# Patient Record
Sex: Male | Born: 1955 | Race: White | Hispanic: No | Smoking: Never smoker
Health system: Southern US, Community
[De-identification: ages and names within clinical notes are randomized; demographics above are authoritative.]

## PROBLEM LIST (undated history)

## (undated) DIAGNOSIS — N529 Male erectile dysfunction, unspecified: Secondary | ICD-10-CM

## (undated) DIAGNOSIS — M519 Unspecified thoracic, thoracolumbar and lumbosacral intervertebral disc disorder: Secondary | ICD-10-CM

## (undated) DIAGNOSIS — I10 Essential (primary) hypertension: Secondary | ICD-10-CM

## (undated) DIAGNOSIS — N200 Calculus of kidney: Secondary | ICD-10-CM

## (undated) DIAGNOSIS — E7800 Pure hypercholesterolemia, unspecified: Secondary | ICD-10-CM

## (undated) DIAGNOSIS — N400 Enlarged prostate without lower urinary tract symptoms: Secondary | ICD-10-CM

## (undated) HISTORY — DX: Essential (primary) hypertension: I10

## (undated) HISTORY — DX: Pure hypercholesterolemia, unspecified: E78.00

## (undated) HISTORY — DX: Male erectile dysfunction, unspecified: N52.9

## (undated) HISTORY — DX: Unspecified thoracic, thoracolumbar and lumbosacral intervertebral disc disorder: M51.9

## (undated) HISTORY — DX: Calculus of kidney: N20.0

## (undated) HISTORY — DX: Benign prostatic hyperplasia without lower urinary tract symptoms: N40.0

---

## 2006-10-01 DIAGNOSIS — I10 Essential (primary) hypertension: Secondary | ICD-10-CM | POA: Insufficient documentation

## 2006-10-17 ENCOUNTER — Ambulatory Visit: Payer: Self-pay | Admitting: Gastroenterology

## 2009-02-16 ENCOUNTER — Ambulatory Visit: Payer: Self-pay | Admitting: Internal Medicine

## 2009-03-01 ENCOUNTER — Ambulatory Visit: Payer: Self-pay | Admitting: Internal Medicine

## 2011-05-15 DIAGNOSIS — E785 Hyperlipidemia, unspecified: Secondary | ICD-10-CM | POA: Insufficient documentation

## 2014-09-02 ENCOUNTER — Emergency Department: Payer: Self-pay | Admitting: Emergency Medicine

## 2014-11-01 DIAGNOSIS — S82141D Displaced bicondylar fracture of right tibia, subsequent encounter for closed fracture with routine healing: Secondary | ICD-10-CM | POA: Insufficient documentation

## 2015-02-11 DIAGNOSIS — M5430 Sciatica, unspecified side: Secondary | ICD-10-CM | POA: Insufficient documentation

## 2015-02-11 DIAGNOSIS — L500 Allergic urticaria: Secondary | ICD-10-CM | POA: Insufficient documentation

## 2015-02-11 DIAGNOSIS — M545 Low back pain, unspecified: Secondary | ICD-10-CM | POA: Insufficient documentation

## 2016-09-09 IMAGING — CT CT OF THE RIGHT KNEE WITHOUT CONTRAST
5 of 7 series · 14 of 33 positions shown, 15 images · non-contrast
Comparison: None.

CLINICAL DATA: Pt states he was working this am, fell through
floor, landed on his right leg, felt instant pain in his right knee,
states he can move it but cannot put weight on it.

EXAM:
CT OF THE RIGHT KNEE WITHOUT CONTRAST
TECHNIQUE: Multidetector CT imaging of the RIGHT knee was performed according
to the standard protocol. Multiplanar CT image reconstructions were
also generated.

[Series 2: knee · axial · 0.34mm/px · z∈[+676,+788]mm · 4 of 125 slices shown, 5 images]
[im 25/125  soft-tissue]
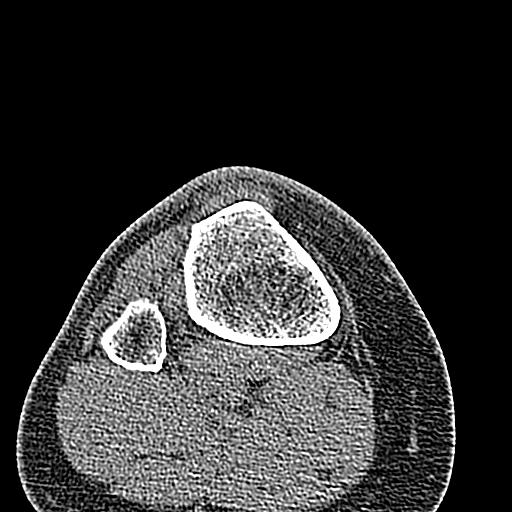
[im 25/125  bone]
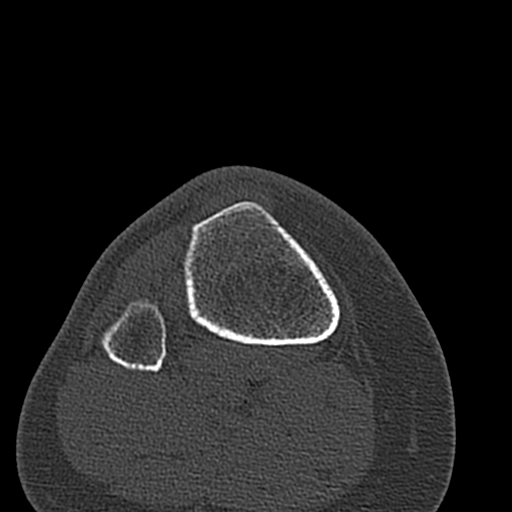
[im 50/125  bone]
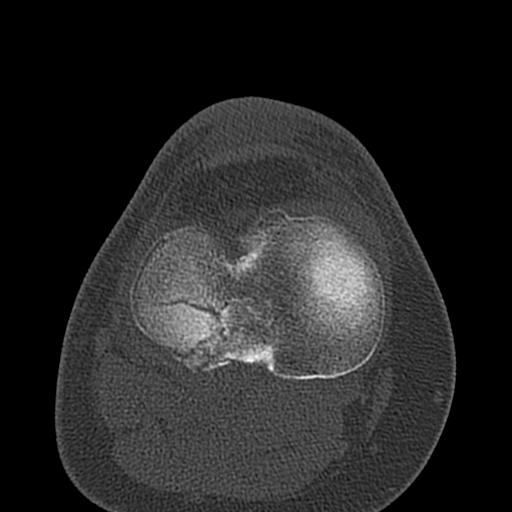
[im 75/125  bone]
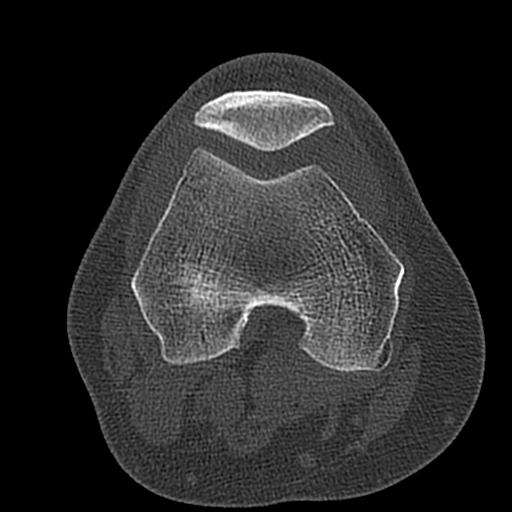
[im 100/125  bone]
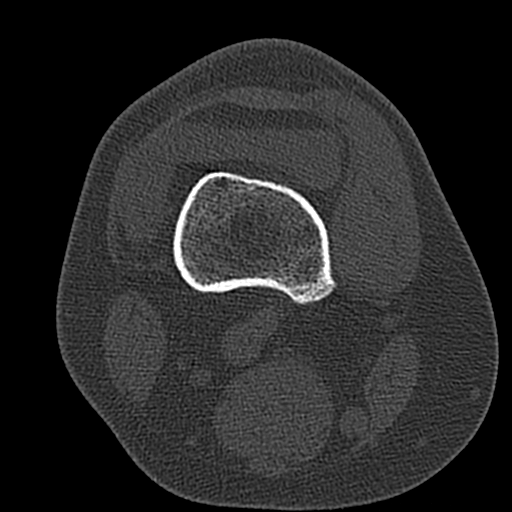

[Series 5: ax knee bone · axial · 0.34mm/px · z∈[+702,+764]mm · 2 of 94 slices shown]
[im 32/94  bone]
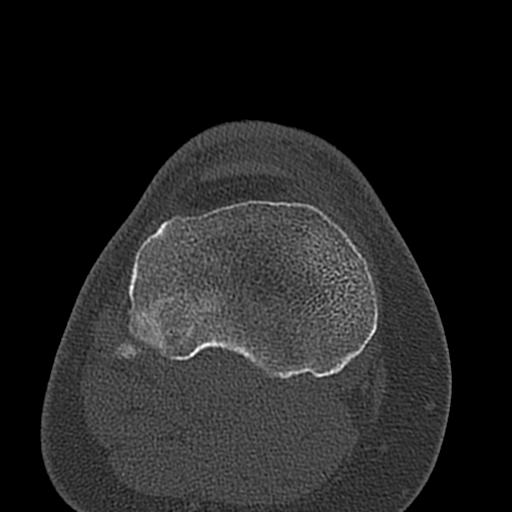
[im 63/94  bone]
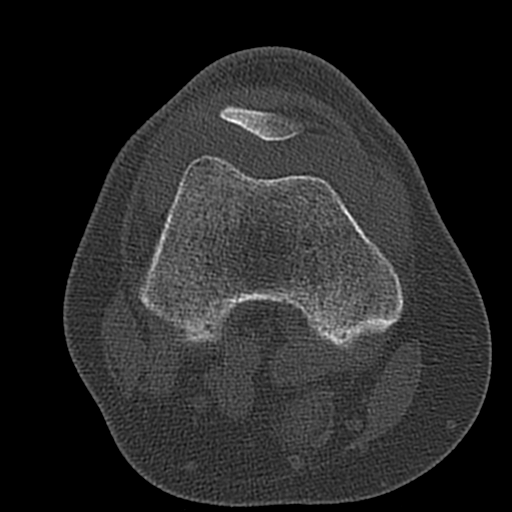

[Series 602: s axial knee 2x2 · axial · 0.37mm/px · z∈[+701,+753]mm · 2 of 78 slices shown]
[im 26/78  bone]
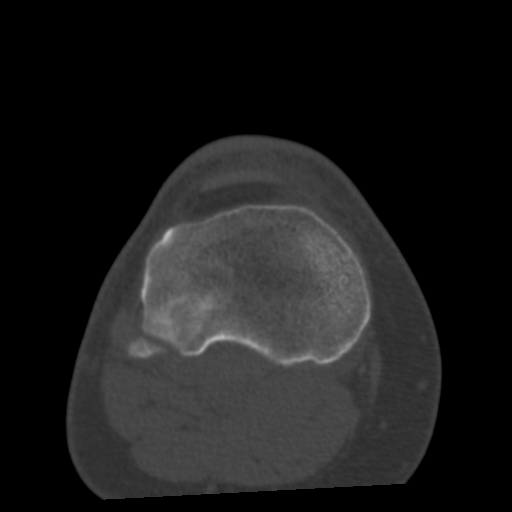
[im 52/78  bone]
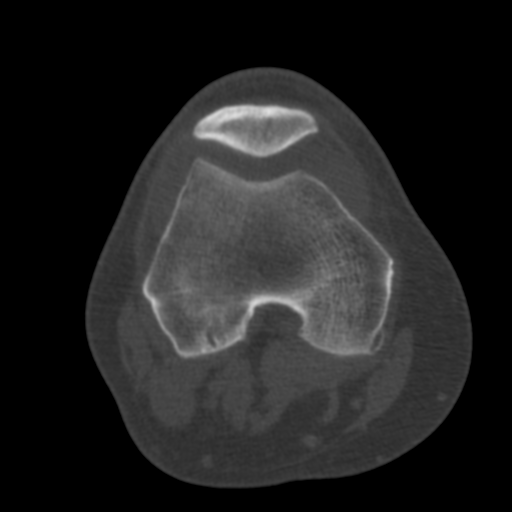

[Series 603: s coronal knee · coronal · 0.37mm/px · 1 of 77 slices shown]
[im 39/77  bone]
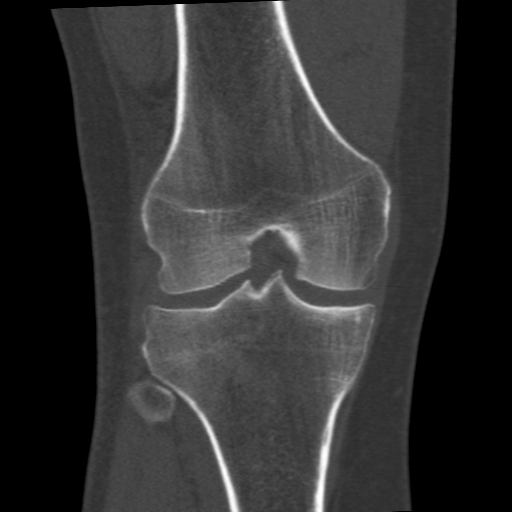

[Series 604: s sag knee · sagittal · 0.37mm/px · 5 of 67 slices shown]
[im 12/67  bone]
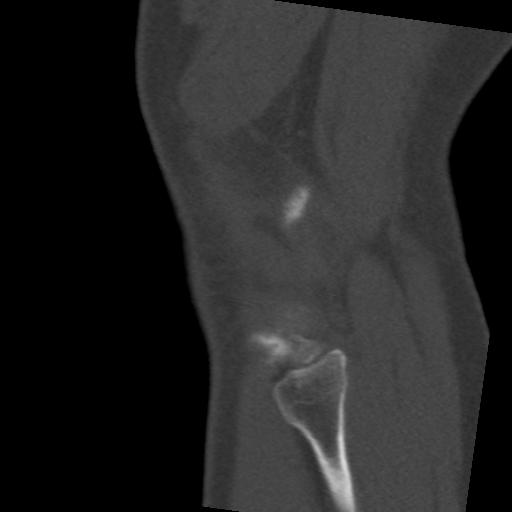
[im 23/67  bone]
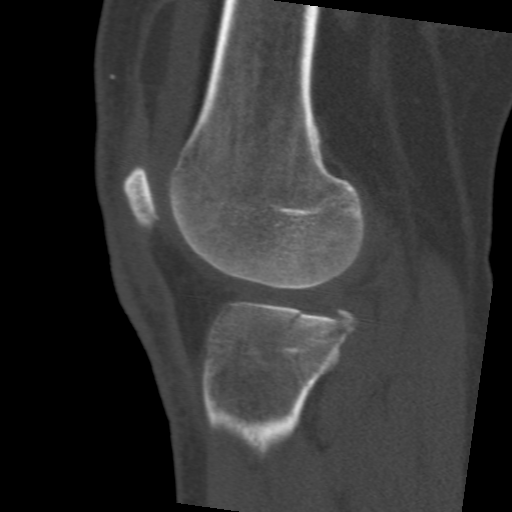
[im 34/67  bone]
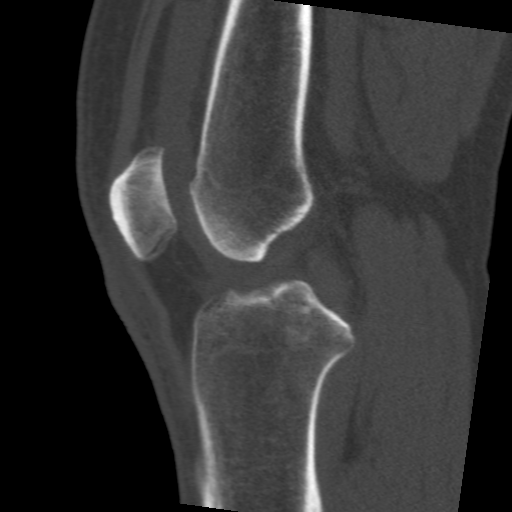
[im 45/67  bone]
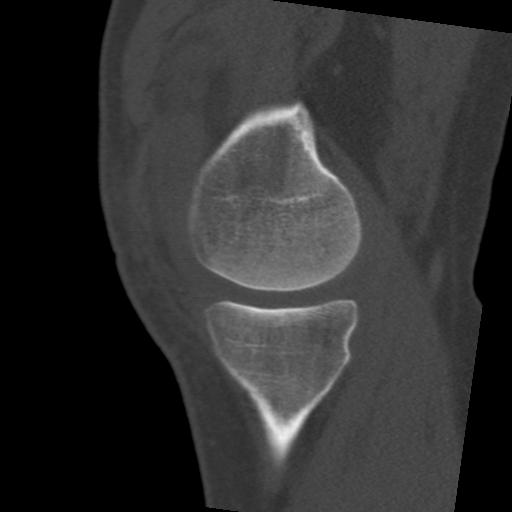
[im 56/67  bone]
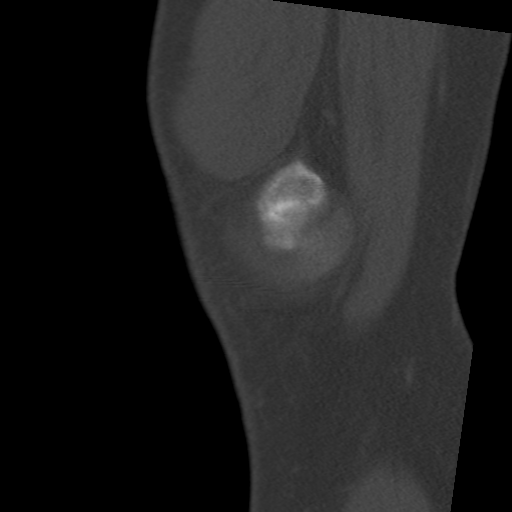

[14 of 33 positions shown; findings below may reference images not displayed]

FINDINGS: There is a mildly comminuted posterior lateral tibial plateau
fracture with fracture cleft extending to the medial aspect of the
lateral tibial plateau articular surface. There is a fracture cleft
extending to the articular surface of the tibiofibular joint. There
is 1-2 mm of depression of the posterior lateral tibial plateau
relative to the remainder of the articular surface. There is no
fracture extending into the diaphysis or into any medial tibial
plateau.

There is a large lipohemarthrosis.

There is no other fracture or dislocation. There is no lytic or
sclerotic osseous lesion. The ACL and PCL are grossly intact. The
extensor mechanism is grossly intact. There is prepatellar tendon
soft tissue edema. There is no Baker cyst. The muscles are normal.
There is no hematoma.
IMPRESSION: 1. Mildly comminuted right posterior lateral tibial plateau fracture
with 1-2 mm of depression of the posterior lateral tibial plateau
articular surface.

## 2018-09-12 DIAGNOSIS — J301 Allergic rhinitis due to pollen: Secondary | ICD-10-CM | POA: Insufficient documentation

## 2020-06-03 ENCOUNTER — Encounter: Payer: Self-pay | Admitting: Oncology

## 2020-06-03 ENCOUNTER — Other Ambulatory Visit: Payer: Self-pay

## 2020-06-03 ENCOUNTER — Inpatient Hospital Stay: Payer: BLUE CROSS/BLUE SHIELD

## 2020-06-03 ENCOUNTER — Inpatient Hospital Stay: Payer: BLUE CROSS/BLUE SHIELD | Attending: Oncology | Admitting: Oncology

## 2020-06-03 VITALS — BP 121/83 | HR 68 | Temp 98.2°F | Resp 16 | Ht 70.6 in | Wt 243.8 lb

## 2020-06-03 DIAGNOSIS — Z79899 Other long term (current) drug therapy: Secondary | ICD-10-CM | POA: Insufficient documentation

## 2020-06-03 DIAGNOSIS — D696 Thrombocytopenia, unspecified: Secondary | ICD-10-CM

## 2020-06-03 DIAGNOSIS — Z87442 Personal history of urinary calculi: Secondary | ICD-10-CM | POA: Diagnosis not present

## 2020-06-03 DIAGNOSIS — Z7982 Long term (current) use of aspirin: Secondary | ICD-10-CM | POA: Diagnosis not present

## 2020-06-03 DIAGNOSIS — N400 Enlarged prostate without lower urinary tract symptoms: Secondary | ICD-10-CM | POA: Diagnosis not present

## 2020-06-03 DIAGNOSIS — I10 Essential (primary) hypertension: Secondary | ICD-10-CM | POA: Diagnosis not present

## 2020-06-03 DIAGNOSIS — E7800 Pure hypercholesterolemia, unspecified: Secondary | ICD-10-CM | POA: Diagnosis not present

## 2020-06-03 LAB — CBC WITH DIFFERENTIAL/PLATELET
Abs Immature Granulocytes: 0.01 10*3/uL (ref 0.00–0.07)
Basophils Absolute: 0 10*3/uL (ref 0.0–0.1)
Basophils Relative: 1 %
Eosinophils Absolute: 0.2 10*3/uL (ref 0.0–0.5)
Eosinophils Relative: 3 %
HCT: 45.8 % (ref 39.0–52.0)
Hemoglobin: 15.6 g/dL (ref 13.0–17.0)
Immature Granulocytes: 0 %
Lymphocytes Relative: 24 %
Lymphs Abs: 1.4 10*3/uL (ref 0.7–4.0)
MCH: 29.3 pg (ref 26.0–34.0)
MCHC: 34.1 g/dL (ref 30.0–36.0)
MCV: 86.1 fL (ref 80.0–100.0)
Monocytes Absolute: 0.5 10*3/uL (ref 0.1–1.0)
Monocytes Relative: 8 %
Neutro Abs: 3.7 10*3/uL (ref 1.7–7.7)
Neutrophils Relative %: 64 %
Platelets: 177 10*3/uL (ref 150–400)
RBC: 5.32 MIL/uL (ref 4.22–5.81)
RDW: 12.8 % (ref 11.5–15.5)
WBC: 5.9 10*3/uL (ref 4.0–10.5)
nRBC: 0 % (ref 0.0–0.2)

## 2020-06-03 LAB — LACTATE DEHYDROGENASE: LDH: 161 U/L (ref 98–192)

## 2020-06-03 LAB — TECHNOLOGIST SMEAR REVIEW: Plt Morphology: ADEQUATE

## 2020-06-03 LAB — VITAMIN B12: Vitamin B-12: 738 pg/mL (ref 180–914)

## 2020-06-03 LAB — HIV ANTIBODY (ROUTINE TESTING W REFLEX): HIV Screen 4th Generation wRfx: NONREACTIVE

## 2020-06-03 LAB — HEPATITIS C ANTIBODY: HCV Ab: NONREACTIVE

## 2020-06-03 LAB — FOLATE: Folate: 22 ng/mL (ref >5.9)

## 2020-06-03 NOTE — Progress Notes (Signed)
Pt was sent for low plt count. Eats and drinks good, good BM, chronic pain. For about a week having vertigo right after pt had flu shot the sx started. Gets nauseated at times

## 2020-06-06 ENCOUNTER — Encounter: Payer: Self-pay | Admitting: Oncology

## 2020-06-06 NOTE — Progress Notes (Signed)
Hematology/Oncology Consult note [facility] [phone] [phone]  Patient Care Team: Patient, No Pcp Per as PCP - General (General Practice)   Name of the patient: James Hinton  [id number]  10-27-1955    Reason for referral-thrombocytopenia   Referring physician-Dr. Ellsworth Lennox  Date of visit: 06/06/20   History of presenting illness- Patient is a 64 year old male with a past medical history significant for BPH, hyperlipidemia and hypertension who has been referred to [place] for thrombocytopenia.  Most recent CBC from 09/11/2019 showed white count of 4.9, H&H of 15.5/45.6 and a platelet count of 145.  In May 2021 his platelet count was 140.  And again 145 in October 2021.  Patient reports feeling well and denies any bleeding or bruising.  He appetite and weight have been stable.  Reports having symptoms of room spinning after he received the flu shot about a week ago.  ECOG PS- 1  Pain scale- 0   Review of systems- Review of Systems  Constitutional: Negative for chills, fever, malaise/fatigue and weight loss.  HENT: Negative for congestion, ear discharge and nosebleeds.   Eyes: Negative for blurred vision.  Respiratory: Negative for cough, hemoptysis, sputum production, shortness of breath and wheezing.   Cardiovascular: Negative for chest pain, palpitations, orthopnea and claudication.  Gastrointestinal: Negative for abdominal pain, blood in stool, constipation, diarrhea, heartburn, melena, nausea and vomiting.  Genitourinary: Negative for dysuria, flank pain, frequency, hematuria and urgency.  Musculoskeletal: Negative for back pain, joint pain and myalgias.  Skin: Negative for rash.  Neurological: Negative for dizziness, tingling, focal weakness, seizures, weakness and headaches.  Endo/Heme/Allergies: Does not bruise/bleed easily.  Psychiatric/Behavioral: Negative for depression and suicidal ideas. The patient does not have insomnia.       Allergies  Allergen Reactions  . Lipitor [Atorvastatin] Rash    There are no problems to display for this patient.    Past Medical History:  Diagnosis Date  . BPH (benign prostatic hyperplasia)   . Hypercholesterolemia   . Hypertension   . Intervertebral disc disorder    lumbar region  . Kidney stones   . Male erectile disorder      History reviewed. No pertinent surgical history.  Social History   Socioeconomic History  . Marital status: Divorced    Spouse name: Not on file  . Number of children: Not on file  . Years of education: Not on file  . Highest education level: Not on file  Occupational History  . Not on file  Tobacco Use  . Smoking status: Never Smoker  . Smokeless tobacco: Never Used  Vaping Use  . Vaping Use: Never used  Substance and Sexual Activity  . Alcohol use: Yes    Comment: rare drinking   . Drug use: Never  . Sexual activity: Yes  Other Topics Concern  . Not on file  Social History Narrative  . Not on file   Social Determinants of Health   Financial Resource Strain:   . Difficulty of Paying Living Expenses: Not on file  Food Insecurity:   . Worried About Programme researcher, broadcasting/film/video in the Last Year: Not on file  . Ran Out of Food in the Last Year: Not on file  Transportation Needs:   . Lack of Transportation (Medical): Not on file  . Lack of Transportation (Non-Medical): Not on file  Physical Activity:   . Days of Exercise per Week: Not on file  . Minutes of Exercise per Session: Not on file  Stress:   . Feeling of Stress : Not on file  Social Connections:   . Frequency of Communication with Friends and Family: Not on file  . Frequency of Social Gatherings with Friends and Family: Not on file  . Attends Religious Services: Not on file  . Active Member of Clubs or Organizations: Not on file  . Attends Banker Meetings: Not on file  . Marital Status: Not on file  Intimate Partner Violence:   . Fear of Current or  Ex-Partner: Not on file  . Emotionally Abused: Not on file  . Physically Abused: Not on file  . Sexually Abused: Not on file     Family History  Problem Relation Age of Onset  . Ovarian cancer Sister   . Cancer Brother   . Heart attack Father      Current Outpatient Medications:  .  aspirin EC 81 MG tablet, Take 81 mg by mouth daily. Swallow whole., Disp: , Rfl:  .  b complex vitamins capsule, Take 1 capsule by mouth daily., Disp: , Rfl:  .  benazepril (LOTENSIN) 10 MG tablet, Take 10 mg by mouth daily., Disp: , Rfl:  .  Omega-3 Fatty Acids (FISH OIL) 1200 MG CAPS, Take 1 capsule by mouth in the morning and at bedtime., Disp: , Rfl:  .  rosuvastatin (CRESTOR) 20 MG tablet, Take 20 mg by mouth daily., Disp: , Rfl:  .  sildenafil (VIAGRA) 100 MG tablet, Take 100 mg by mouth daily as needed for erectile dysfunction., Disp: , Rfl:  .  tamsulosin (FLOMAX) 0.4 MG CAPS capsule, Take 0.4 mg by mouth., Disp: , Rfl:    Physical exam:  Vitals:   06/03/20 1340  BP: 121/83  Pulse: 68  Resp: 16  Temp: 98.2 F (36.8 C)  TempSrc: Tympanic  Weight: 243 lb 12.8 oz (110.6 kg)  Height: 5' 10.6" (1.793 m)   Physical Exam Cardiovascular:     Rate and Rhythm: Normal rate and regular rhythm.     Heart sounds: Normal heart sounds.  Pulmonary:     Effort: Pulmonary effort is normal.     Breath sounds: Normal breath sounds.  Abdominal:     General: Bowel sounds are normal.     Palpations: Abdomen is soft.     Comments: No palpable splenomegaly  Skin:    General: Skin is warm and dry.  Neurological:     Mental Status: He is alert and oriented to person, place, and time.        No flowsheet data found. CBC Latest Ref Rng & Units 06/03/2020  WBC 4.0 - 10.5 K/uL 5.9  Hemoglobin 13.0 - 17.0 g/dL 69.6  Hematocrit 39 - 52 % 45.8  Platelets 150 - 400 K/uL 177    Assessment and plan- Patient is a 64 y.o. male referred for mild isolated thrombocytopenia.    Patient has mild  thrombocytopenia with a platelet count between 140-145 with a normal white cell count and hemoglobin.  Today I will check a CBC with differential, smear review, HIV hep C, B12 folate and LDH.  In person or video visit with the patient in 2 weeks time   Thank you for this kind referral and the opportunity to participate in the care of this patient   Visit Diagnosis 1. Thrombocytopenia (HCC)     Dr. Owens Shark, MD, MPH [facility] [phone] 06/06/2020 1:46 PM

## 2020-06-21 ENCOUNTER — Telehealth: Payer: BLUE CROSS/BLUE SHIELD | Admitting: Oncology

## 2020-06-23 ENCOUNTER — Inpatient Hospital Stay: Payer: BLUE CROSS/BLUE SHIELD | Attending: Oncology | Admitting: Oncology

## 2020-06-23 ENCOUNTER — Other Ambulatory Visit: Payer: Self-pay

## 2020-06-23 DIAGNOSIS — D696 Thrombocytopenia, unspecified: Secondary | ICD-10-CM

## 2020-06-26 NOTE — Progress Notes (Signed)
I connected with James Hinton on 06/26/20 at  2:45 PM EST by video enabled telemedicine visit and verified that I am speaking with the correct person using two identifiers.   I discussed the limitations, risks, security and privacy concerns of performing an evaluation and management service by telemedicine and the availability of in-person appointments. I also discussed with the patient that there may be a patient responsible charge related to this service. The patient expressed understanding and agreed to proceed.  Other persons participating in the visit and their role in the encounter:  none  Patient's location:  home Provider's location:  work  Stage manager Complaint: Discuss results of blood work for thrombocytopenia  History of present illness: Patient is a 64 year old male with a past medical history significant for BPH, hyperlipidemia and hypertension who has been referred to [place] for thrombocytopenia.  Most recent CBC from 09/11/2019 showed white count of 4.9, H&H of 15.5/45.6 and a platelet count of 145.  In May 2021 his platelet count was 140.  And again 145 in October 2021.  Patient reports feeling well and denies any bleeding or bruising.   Interval history/; patient reports doing well and denies any acute issues since last visit   Review of Systems  Constitutional: Negative for chills, fever, malaise/fatigue and weight loss.  HENT: Negative for congestion, ear discharge and nosebleeds.   Eyes: Negative for blurred vision.  Respiratory: Negative for cough, hemoptysis, sputum production, shortness of breath and wheezing.   Cardiovascular: Negative for chest pain, palpitations, orthopnea and claudication.  Gastrointestinal: Negative for abdominal pain, blood in stool, constipation, diarrhea, heartburn, melena, nausea and vomiting.  Genitourinary: Negative for dysuria, flank pain, frequency, hematuria and urgency.  Musculoskeletal: Negative for back pain, joint pain and myalgias.  Skin:  Negative for rash.  Neurological: Negative for dizziness, tingling, focal weakness, seizures, weakness and headaches.  Endo/Heme/Allergies: Does not bruise/bleed easily.  Psychiatric/Behavioral: Negative for depression and suicidal ideas. The patient does not have insomnia.     Allergies  Allergen Reactions  . Lipitor [Atorvastatin] Rash    Past Medical History:  Diagnosis Date  . BPH (benign prostatic hyperplasia)   . Hypercholesterolemia   . Hypertension   . Intervertebral disc disorder    lumbar region  . Kidney stones   . Male erectile disorder     No past surgical history on file.  Social History   Socioeconomic History  . Marital status: Divorced    Spouse name: Not on file  . Number of children: Not on file  . Years of education: Not on file  . Highest education level: Not on file  Occupational History  . Not on file  Tobacco Use  . Smoking status: Never Smoker  . Smokeless tobacco: Never Used  Vaping Use  . Vaping Use: Never used  Substance and Sexual Activity  . Alcohol use: Yes    Comment: rare drinking   . Drug use: Never  . Sexual activity: Yes  Other Topics Concern  . Not on file  Social History Narrative  . Not on file   Social Determinants of Health   Financial Resource Strain:   . Difficulty of Paying Living Expenses: Not on file  Food Insecurity:   . Worried About Programme researcher, broadcasting/film/video in the Last Year: Not on file  . Ran Out of Food in the Last Year: Not on file  Transportation Needs:   . Lack of Transportation (Medical): Not on file  . Lack of Transportation (Non-Medical): Not  on file  Physical Activity:   . Days of Exercise per Week: Not on file  . Minutes of Exercise per Session: Not on file  Stress:   . Feeling of Stress : Not on file  Social Connections:   . Frequency of Communication with Friends and Family: Not on file  . Frequency of Social Gatherings with Friends and Family: Not on file  . Attends Religious Services: Not on  file  . Active Member of Clubs or Organizations: Not on file  . Attends Banker Meetings: Not on file  . Marital Status: Not on file  Intimate Partner Violence:   . Fear of Current or Ex-Partner: Not on file  . Emotionally Abused: Not on file  . Physically Abused: Not on file  . Sexually Abused: Not on file    Family History  Problem Relation Age of Onset  . Ovarian cancer Sister   . Cancer Brother   . Heart attack Father      Current Outpatient Medications:  .  aspirin EC 81 MG tablet, Take 81 mg by mouth daily. Swallow whole., Disp: , Rfl:  .  b complex vitamins capsule, Take 1 capsule by mouth daily., Disp: , Rfl:  .  benazepril (LOTENSIN) 10 MG tablet, Take 10 mg by mouth daily., Disp: , Rfl:  .  Omega-3 Fatty Acids (FISH OIL) 1200 MG CAPS, Take 1 capsule by mouth in the morning and at bedtime., Disp: , Rfl:  .  rosuvastatin (CRESTOR) 20 MG tablet, Take 20 mg by mouth daily., Disp: , Rfl:  .  sildenafil (VIAGRA) 100 MG tablet, Take 100 mg by mouth daily as needed for erectile dysfunction., Disp: , Rfl:  .  tamsulosin (FLOMAX) 0.4 MG CAPS capsule, Take 0.4 mg by mouth., Disp: , Rfl:   No results found.  No images are attached to the encounter.   No flowsheet data found. CBC Latest Ref Rng & Units 06/03/2020  WBC 4.0 - 10.5 K/uL 5.9  Hemoglobin 13.0 - 17.0 g/dL 38.8  Hematocrit 39 - 52 % 45.8  Platelets 150 - 400 K/uL 177     Observation/objective: No acute distress over video visit today.  Breathing is nonlabored  Assessment and plan: Patient is a 64 year old male referred for mild isolated thrombocytopenia. This is a visit to discuss results of blood work  Repeat CBC with differential showed a normal platelet count of 177.  White count and hemoglobin are normal.  HIV and hepatitis C testing negative.  B12 and folate normal.  LDH normal and smear review was unremarkable.  Patient can continue to follow-up with his primary care doctor at this time and no  further work-up was needed.  If patient has any worsening cytopenias in the future he can be referred to [place] for any questions or concerns  Also patient has [employer] and therefore cannot be seen within the floor network and has to go to [facility] for his future healthcare needs  Follow-up instructions:  I discussed the assessment and treatment plan with the patient. The patient was provided an opportunity to ask questions and all were answered. The patient agreed with the plan and demonstrated an understanding of the instructions.   The patient was advised to call back or seek an in-person evaluation if the symptoms worsen or if the condition fails to improve as anticipated. Visit Diagnosis: 1. Thrombocytopenia (HCC)     Dr. Owens Shark, MD, MPH [facility] Tel- [phone]  06/26/2020 3:59 PM

## 2022-11-09 ENCOUNTER — Ambulatory Visit: Payer: Medicare Other | Admitting: Internal Medicine

## 2022-11-16 ENCOUNTER — Encounter: Payer: Self-pay | Admitting: Nurse Practitioner

## 2022-11-16 ENCOUNTER — Ambulatory Visit (INDEPENDENT_AMBULATORY_CARE_PROVIDER_SITE_OTHER): Payer: Medicare Other | Admitting: Nurse Practitioner

## 2022-11-16 VITALS — BP 100/62 | HR 63 | Ht 70.5 in | Wt 215.6 lb

## 2022-11-16 DIAGNOSIS — R5383 Other fatigue: Secondary | ICD-10-CM

## 2022-11-16 DIAGNOSIS — E663 Overweight: Secondary | ICD-10-CM | POA: Insufficient documentation

## 2022-11-16 DIAGNOSIS — R42 Dizziness and giddiness: Secondary | ICD-10-CM | POA: Diagnosis not present

## 2022-11-16 DIAGNOSIS — R7301 Impaired fasting glucose: Secondary | ICD-10-CM

## 2022-11-16 DIAGNOSIS — M349 Systemic sclerosis, unspecified: Secondary | ICD-10-CM | POA: Insufficient documentation

## 2022-11-16 NOTE — Progress Notes (Signed)
Established Patient Office Visit  Subjective:  Patient ID: James Hinton, male    DOB: 1955-08-08  Age: 67 y.o. MRN: [id number]  No chief complaint on file.   Acute visit, c/o dizziness.  Orthostatic BP was lower when patient stood today in office visit.  Patient consumes 4 cups of caffeinated coffee each morning and more water in the afternoons.  He is being seen at [facility] for his scleroderma.      No other concerns at this time.   Past Medical History:  Diagnosis Date   BPH (benign prostatic hyperplasia)    Hypercholesterolemia    Hypertension    Intervertebral disc disorder    lumbar region   Kidney stones    Male erectile disorder     No past surgical history on file.  Social History   Socioeconomic History   Marital status: Divorced    Spouse name: Not on file   Number of children: Not on file   Years of education: Not on file   Highest education level: Not on file  Occupational History   Not on file  Tobacco Use   Smoking status: Never   Smokeless tobacco: Never  Vaping Use   Vaping Use: Never used  Substance and Sexual Activity   Alcohol use: Yes    Comment: rare drinking    Drug use: Never   Sexual activity: Yes  Other Topics Concern   Not on file  Social History Narrative   Not on file   Social Determinants of Health   Financial Resource Strain: Not on file  Food Insecurity: Not on file  Transportation Needs: Not on file  Physical Activity: Not on file  Stress: Not on file  Social Connections: Not on file  Intimate Partner Violence: Not on file    Family History  Problem Relation Age of Onset   Ovarian cancer Sister    Cancer Brother    Heart attack Father     Allergies  Allergen Reactions   Lipitor [Atorvastatin] Rash    Review of Systems  Constitutional:  Positive for malaise/fatigue.  HENT: Negative.    Eyes: Negative.   Respiratory: Negative.    Cardiovascular: Negative.   Gastrointestinal: Negative.   Genitourinary:  Negative.   Musculoskeletal: Negative.   Skin: Negative.   Neurological: Negative.   Endo/Heme/Allergies: Negative.   Psychiatric/Behavioral: Negative.         Objective:   There were no vitals taken for this visit.  There were no vitals filed for this visit.  Physical Exam Vitals reviewed.  Constitutional:      Appearance: Normal appearance.  HENT:     Head: Normocephalic.     Nose: Nose normal.     Mouth/Throat:     Mouth: Mucous membranes are moist.  Eyes:     Pupils: Pupils are equal, round, and reactive to light.  Pulmonary:     Effort: Pulmonary effort is normal.     Breath sounds: Normal breath sounds.  Abdominal:     General: Bowel sounds are normal.     Palpations: Abdomen is soft.  Musculoskeletal:        General: Normal range of motion.     Cervical back: Normal range of motion and neck supple.  Skin:    General: Skin is warm and dry.  Neurological:     Mental Status: He is alert and oriented to person, place, and time.  Psychiatric:        Mood and Affect:  Mood normal.        Behavior: Behavior normal.      No results found for any visits on 11/16/22.      Assessment & Plan:   Problem List Items Addressed This Visit   None   No follow-ups on file.   Total time spent: 35 minutes  Orson Eva, NP  11/16/2022

## 2022-11-16 NOTE — Patient Instructions (Signed)
1) Less caffeine, more water earlier in the day 2) Cont to hold benazapril 3) Bring both BP cuffs to the next office visit so we can compare accuracy 4) Follow up appt in 1 week

## 2022-11-22 ENCOUNTER — Other Ambulatory Visit: Payer: Medicare Other

## 2022-11-23 ENCOUNTER — Ambulatory Visit (INDEPENDENT_AMBULATORY_CARE_PROVIDER_SITE_OTHER): Payer: Medicare Other | Admitting: Family

## 2022-11-23 ENCOUNTER — Encounter: Payer: Self-pay | Admitting: Family

## 2022-11-23 VITALS — BP 120/68 | HR 72 | Ht 70.5 in | Wt 211.8 lb

## 2022-11-23 DIAGNOSIS — R5383 Other fatigue: Secondary | ICD-10-CM | POA: Diagnosis not present

## 2022-11-23 DIAGNOSIS — E782 Mixed hyperlipidemia: Secondary | ICD-10-CM | POA: Diagnosis not present

## 2022-11-23 DIAGNOSIS — M349 Systemic sclerosis, unspecified: Secondary | ICD-10-CM

## 2022-11-23 DIAGNOSIS — D696 Thrombocytopenia, unspecified: Secondary | ICD-10-CM

## 2022-11-23 DIAGNOSIS — I10 Essential (primary) hypertension: Secondary | ICD-10-CM | POA: Diagnosis not present

## 2022-11-23 DIAGNOSIS — R42 Dizziness and giddiness: Secondary | ICD-10-CM

## 2022-11-23 LAB — CMP14+EGFR
ALT: 11 IU/L (ref 0–44)
AST: 18 IU/L (ref 0–40)
Albumin/Globulin Ratio: 1.7 (ref 1.2–2.2)
Albumin: 3.8 g/dL — ABNORMAL LOW (ref 3.9–4.9)
Alkaline Phosphatase: 78 IU/L (ref 44–121)
BUN/Creatinine Ratio: 19 (ref 10–24)
BUN: 18 mg/dL (ref 8–27)
Bilirubin Total: 0.6 mg/dL (ref 0.0–1.2)
CO2: 22 mmol/L (ref 20–29)
Calcium: 9.2 mg/dL (ref 8.6–10.2)
Chloride: 103 mmol/L (ref 96–106)
Creatinine, Ser: 0.96 mg/dL (ref 0.76–1.27)
Globulin, Total: 2.2 g/dL (ref 1.5–4.5)
Glucose: 79 mg/dL (ref 70–99)
Potassium: 4.8 mmol/L (ref 3.5–5.2)
Sodium: 138 mmol/L (ref 134–144)
Total Protein: 6 g/dL (ref 6.0–8.5)
eGFR: 87 mL/min/{1.73_m2} (ref >59)

## 2022-11-23 LAB — LIPID PANEL
Chol/HDL Ratio: 3.1 ratio (ref 0.0–5.0)
Cholesterol, Total: 200 mg/dL — ABNORMAL HIGH (ref 100–199)
HDL: 64 mg/dL (ref >39)
LDL Chol Calc (NIH): 125 mg/dL — ABNORMAL HIGH (ref 0–99)
Triglycerides: 61 mg/dL (ref 0–149)
VLDL Cholesterol Cal: 11 mg/dL (ref 5–40)

## 2022-11-23 LAB — HEMOGLOBIN A1C
Est. average glucose Bld gHb Est-mCnc: 120 mg/dL
Hgb A1c MFr Bld: 5.8 % — ABNORMAL HIGH (ref 4.8–5.6)

## 2022-11-23 LAB — TSH: TSH: 1.22 u[IU]/mL (ref 0.450–4.500)

## 2022-11-23 NOTE — Assessment & Plan Note (Signed)
Adding CBC to labs drawn yesterday.  Want to double check for any anemia. Also checking platelets. Will call with results.

## 2022-11-23 NOTE — Assessment & Plan Note (Signed)
Blood pressure is well controlled despite holding meds.  Continue holding.   BP cuff is accurate.  Continue taking.

## 2022-11-23 NOTE — Progress Notes (Signed)
Established Patient Office Visit  Subjective:  Patient ID: James Hinton, male    DOB: 1955/09/01  Age: 67 y.o. MRN: [id number]  Chief Complaint  Patient presents with   Follow-up    1 week follow up    1 week follow up.   Patient has been feeling well in general, he says that he is doing much better since his last appointment.  He has continued to hold the benazepril and his blood pressures have been running very similar to what they are in the office today.    His blood pressure cuffs are both accurate, checked against manual reading in office today.   No other concerns at this time.   Past Medical History:  Diagnosis Date   BPH (benign prostatic hyperplasia)    Hypercholesterolemia    Hypertension    Intervertebral disc disorder    lumbar region   Kidney stones    Male erectile disorder     No past surgical history on file.  Social History   Socioeconomic History   Marital status: Divorced    Spouse name: Not on file   Number of children: Not on file   Years of education: Not on file   Highest education level: Not on file  Occupational History   Not on file  Tobacco Use   Smoking status: Never   Smokeless tobacco: Never  Vaping Use   Vaping Use: Never used  Substance and Sexual Activity   Alcohol use: Yes    Comment: rare drinking    Drug use: Never   Sexual activity: Yes  Other Topics Concern   Not on file  Social History Narrative   Not on file   Social Determinants of Health   Financial Resource Strain: Not on file  Food Insecurity: Not on file  Transportation Needs: Not on file  Physical Activity: Not on file  Stress: Not on file  Social Connections: Not on file  Intimate Partner Violence: Not on file    Family History  Problem Relation Age of Onset   Ovarian cancer Sister    Cancer Brother    Heart attack Father     Allergies  Allergen Reactions   Atorvastatin Rash, Hives and Itching   Rosuvastatin Other (See Comments)     Review of Systems  All other systems reviewed and are negative.      Objective:   BP 120/68   Pulse 72   Ht 5' 10.5" (1.791 m)   Wt 211 lb 12.8 oz (96.1 kg)   SpO2 96%   BMI 29.96 kg/m   Vitals:   11/23/22 1017  BP: 120/68  Pulse: 72  Height: 5' 10.5" (1.791 m)  Weight: 211 lb 12.8 oz (96.1 kg)  SpO2: 96%  BMI (Calculated): 29.95    Physical Exam Vitals and nursing note reviewed.  Constitutional:      General: He is awake.     Appearance: Normal appearance. He is well-developed and normal weight.  Eyes:     Extraocular Movements: Extraocular movements intact.     Pupils: Pupils are equal, round, and reactive to light.  Cardiovascular:     Rate and Rhythm: Normal rate and regular rhythm.     Pulses: Normal pulses.     Heart sounds: Normal heart sounds.  Pulmonary:     Effort: Pulmonary effort is normal.     Breath sounds: Normal breath sounds.  Neurological:     General: No focal deficit present.  Mental Status: He is alert and oriented to person, place, and time. Mental status is at baseline.  Psychiatric:        Mood and Affect: Mood normal.        Behavior: Behavior is cooperative.        Thought Content: Thought content normal.        Judgment: Judgment normal.      No results found for any visits on 11/23/22.  Recent Results (from the past 2160 hour(s))  Hemoglobin A1c     Status: Abnormal   Collection Time: 11/22/22  8:23 AM  Result Value Ref Range   Hgb A1c MFr Bld 5.8 (H) 4.8 - 5.6 %    Comment:          Prediabetes: 5.7 - 6.4          Diabetes: >6.4          Glycemic control for adults with diabetes: <7.0    Est. average glucose Bld gHb Est-mCnc 120 mg/dL  TSH     Status: None   Collection Time: 11/22/22  8:23 AM  Result Value Ref Range   TSH 1.220 0.450 - 4.500 uIU/mL  CMP14+EGFR     Status: Abnormal   Collection Time: 11/22/22  8:23 AM  Result Value Ref Range   Glucose 79 70 - 99 mg/dL   BUN 18 8 - 27 mg/dL   Creatinine, Ser  1.61 0.76 - 1.27 mg/dL   eGFR 87 >09 UE/AVW/0.98   BUN/Creatinine Ratio 19 10 - 24   Sodium 138 134 - 144 mmol/L   Potassium 4.8 3.5 - 5.2 mmol/L   Chloride 103 96 - 106 mmol/L   CO2 22 20 - 29 mmol/L   Calcium 9.2 8.6 - 10.2 mg/dL   Total Protein 6.0 6.0 - 8.5 g/dL   Albumin 3.8 (L) 3.9 - 4.9 g/dL   Globulin, Total 2.2 1.5 - 4.5 g/dL   Albumin/Globulin Ratio 1.7 1.2 - 2.2   Bilirubin Total 0.6 0.0 - 1.2 mg/dL   Alkaline Phosphatase 78 44 - 121 IU/L   AST 18 0 - 40 IU/L   ALT 11 0 - 44 IU/L  Lipid panel     Status: Abnormal   Collection Time: 11/22/22  8:23 AM  Result Value Ref Range   Cholesterol, Total 200 (H) 100 - 199 mg/dL   Triglycerides 61 0 - 149 mg/dL   HDL 64 >11 mg/dL   VLDL Cholesterol Cal 11 5 - 40 mg/dL   LDL Chol Calc (NIH) 914 (H) 0 - 99 mg/dL   Chol/HDL Ratio 3.1 0.0 - 5.0 ratio    Comment:                                   T. Chol/HDL Ratio                                             Men  Women                               1/2 Avg.Risk  3.4    3.3  Avg.Risk  5.0    4.4                                2X Avg.Risk  9.6    7.1                                3X Avg.Risk 23.4   11.0        Assessment & Plan:   Problem List Items Addressed This Visit     RESOLVED: Dizziness   Scleroderma   Other fatigue   Essential hypertension - Primary    Blood pressure is well controlled despite holding meds.  Continue holding.   BP cuff is accurate.  Continue taking.       Hyperlipidemia   Thrombocytopenia    Adding CBC to labs drawn yesterday.  Want to double check for any anemia. Also checking platelets. Will call with results.        No follow-ups on file.   Total time spent: 20 minutes  Miki Kins, FNP  11/23/2022

## 2022-11-24 LAB — CBC WITH DIFFERENTIAL/PLATELET
Basophils Absolute: 0 10*3/uL (ref 0.0–0.2)
Basos: 1 %
EOS (ABSOLUTE): 0.1 10*3/uL (ref 0.0–0.4)
Eos: 3 %
Hematocrit: 46.5 % (ref 37.5–51.0)
Hemoglobin: 15.3 g/dL (ref 13.0–17.7)
Immature Grans (Abs): 0 10*3/uL (ref 0.0–0.1)
Immature Granulocytes: 0 %
Lymphocytes Absolute: 0.9 10*3/uL (ref 0.7–3.1)
Lymphs: 22 %
MCH: 29.9 pg (ref 26.6–33.0)
MCHC: 32.9 g/dL (ref 31.5–35.7)
MCV: 91 fL (ref 79–97)
Monocytes Absolute: 0.4 10*3/uL (ref 0.1–0.9)
Monocytes: 9 %
Neutrophils Absolute: 2.7 10*3/uL (ref 1.4–7.0)
Neutrophils: 65 %
Platelets: 170 10*3/uL (ref 150–450)
RBC: 5.11 x10E6/uL (ref 4.14–5.80)
RDW: 13.6 % (ref 11.6–15.4)
WBC: 4.1 10*3/uL (ref 3.4–10.8)

## 2022-11-24 LAB — SPECIMEN STATUS REPORT

## 2022-11-30 ENCOUNTER — Ambulatory Visit: Payer: Medicare Other | Admitting: Internal Medicine

## 2023-01-12 ENCOUNTER — Other Ambulatory Visit: Payer: Self-pay | Admitting: Internal Medicine

## 2023-01-12 DIAGNOSIS — K2100 Gastro-esophageal reflux disease with esophagitis, without bleeding: Secondary | ICD-10-CM

## 2023-01-12 DIAGNOSIS — J301 Allergic rhinitis due to pollen: Secondary | ICD-10-CM

## 2023-02-11 ENCOUNTER — Other Ambulatory Visit: Payer: Self-pay | Admitting: Internal Medicine

## 2023-04-13 ENCOUNTER — Other Ambulatory Visit: Payer: Self-pay | Admitting: Internal Medicine

## 2023-04-13 DIAGNOSIS — J301 Allergic rhinitis due to pollen: Secondary | ICD-10-CM

## 2023-04-26 ENCOUNTER — Other Ambulatory Visit: Payer: Medicare Other

## 2023-04-26 ENCOUNTER — Other Ambulatory Visit: Payer: Self-pay | Admitting: Internal Medicine

## 2023-04-27 LAB — LIPID PANEL W/O CHOL/HDL RATIO
Cholesterol, Total: 230 mg/dL — ABNORMAL HIGH (ref 100–199)
HDL: 60 mg/dL (ref >39)
LDL Chol Calc (NIH): 152 mg/dL — ABNORMAL HIGH (ref 0–99)
Triglycerides: 101 mg/dL (ref 0–149)
VLDL Cholesterol Cal: 18 mg/dL (ref 5–40)

## 2023-04-27 LAB — COMPREHENSIVE METABOLIC PANEL
ALT: 10 IU/L (ref 0–44)
AST: 14 IU/L (ref 0–40)
Albumin: 4.1 g/dL (ref 3.9–4.9)
Alkaline Phosphatase: 87 IU/L (ref 44–121)
BUN/Creatinine Ratio: 17 (ref 10–24)
BUN: 20 mg/dL (ref 8–27)
Bilirubin Total: 0.7 mg/dL (ref 0.0–1.2)
CO2: 23 mmol/L (ref 20–29)
Calcium: 9.2 mg/dL (ref 8.6–10.2)
Chloride: 102 mmol/L (ref 96–106)
Creatinine, Ser: 1.2 mg/dL (ref 0.76–1.27)
Globulin, Total: 2 g/dL (ref 1.5–4.5)
Glucose: 88 mg/dL (ref 70–99)
Potassium: 4.6 mmol/L (ref 3.5–5.2)
Sodium: 141 mmol/L (ref 134–144)
Total Protein: 6.1 g/dL (ref 6.0–8.5)
eGFR: 67 mL/min/{1.73_m2} (ref >59)

## 2023-05-03 ENCOUNTER — Ambulatory Visit: Payer: Medicare Other | Admitting: Internal Medicine

## 2023-05-10 ENCOUNTER — Ambulatory Visit (INDEPENDENT_AMBULATORY_CARE_PROVIDER_SITE_OTHER): Payer: Medicare Other | Admitting: Internal Medicine

## 2023-05-10 VITALS — BP 115/80 | HR 84 | Ht 70.5 in | Wt 210.0 lb

## 2023-05-10 DIAGNOSIS — E782 Mixed hyperlipidemia: Secondary | ICD-10-CM | POA: Diagnosis not present

## 2023-05-10 DIAGNOSIS — N401 Enlarged prostate with lower urinary tract symptoms: Secondary | ICD-10-CM | POA: Diagnosis not present

## 2023-05-10 DIAGNOSIS — I10 Essential (primary) hypertension: Secondary | ICD-10-CM | POA: Diagnosis not present

## 2023-05-10 DIAGNOSIS — R351 Nocturia: Secondary | ICD-10-CM

## 2023-05-10 MED ORDER — TAMSULOSIN HCL 0.4 MG PO CAPS
0.4000 mg | ORAL_CAPSULE | Freq: Every day | ORAL | 1 refills | Status: DC
Start: 2023-05-10 — End: 2023-10-18

## 2023-05-10 NOTE — Progress Notes (Signed)
Established Patient Office Visit  Subjective:  Patient ID: James Hinton, male    DOB: Nov 21, 1955  Age: 67 y.o. MRN: [id number]  Chief Complaint  Patient presents with   Follow-up    3 mo f/u    No new complaints, here for lab review and medication refills. Labs reviewed and notable for deterioration in TC and LDL as he was unable to afford Nexletol. Reports chronic left shoulder pain, worse at night when he lies on it. However received an intra-articular injection a few months ago and also applies topical "icy hot" when needed.  No other concerns at this time.   Past Medical History:  Diagnosis Date   BPH (benign prostatic hyperplasia)    Hypercholesterolemia    Hypertension    Intervertebral disc disorder    lumbar region   Kidney stones    Male erectile disorder     No past surgical history on file.  Social History   Socioeconomic History   Marital status: Divorced    Spouse name: Not on file   Number of children: Not on file   Years of education: Not on file   Highest education level: Not on file  Occupational History   Not on file  Tobacco Use   Smoking status: Never   Smokeless tobacco: Never  Vaping Use   Vaping status: Never Used  Substance and Sexual Activity   Alcohol use: Yes    Comment: rare drinking    Drug use: Never   Sexual activity: Yes  Other Topics Concern   Not on file  Social History Narrative   Not on file   Social Determinants of Health   Financial Resource Strain: Low Risk  (07/01/2019)   Received from [facility], [facility]   Overall Financial Resource Strain (CARDIA)    Difficulty of Paying Living Expenses: Not hard at all  Food Insecurity: No Food Insecurity (07/01/2019)   Received from [facility], [facility]   Hunger Vital Sign    Worried About Running Out of Food in the Last Year: Never true    Ran Out of Food in the Last Year: Never true  Transportation Needs: No Transportation Needs (07/01/2019)    Received from [facility], [facility]   [facility] - Transportation    Lack of Transportation (Medical): No    Lack of Transportation (Non-Medical): No  Physical Activity: Not on file  Stress: Not on file  Social Connections: Unknown (07/01/2019)   Received from [facility], [facility]   Social Connection and Isolation Panel [NHANES]    Frequency of Communication with Friends and Family: Not on file    Frequency of Social Gatherings with Friends and Family: Not on file    Attends Religious Services: Not on file    Active Member of Clubs or Organizations: Not on file    Attends Banker Meetings: Not on file    Marital Status: Married  Intimate Partner Violence: Not At Risk (07/01/2019)   Received from [facility], [facility]   Humiliation, Afraid, Rape, and Kick questionnaire    Fear of Current or Ex-Partner: No    Emotionally Abused: No    Physically Abused: No    Sexually Abused: No    Family History  Problem Relation Age of Onset   Ovarian cancer Sister    Cancer Brother    Heart attack Father     Allergies  Allergen Reactions  Atorvastatin Rash, Hives and Itching   Rosuvastatin Other (See Comments)    Review of Systems  Constitutional: Negative.   HENT: Negative.    Eyes: Negative.   Respiratory: Negative.    Cardiovascular: Negative.   Gastrointestinal: Negative.   Genitourinary: Negative.   Musculoskeletal:  Positive for joint pain (left shoulder).  Skin: Negative.   Neurological: Negative.   Endo/Heme/Allergies: Negative.        Objective:   BP 115/80   Pulse 84   Ht 5' 10.5" (1.791 m)   Wt 210 lb (95.3 kg)   SpO2 96%   BMI 29.71 kg/m   Vitals:   05/10/23 1535  BP: 115/80  Pulse: 84  Height: 5' 10.5" (1.791 m)  Weight: 210 lb (95.3 kg)  SpO2: 96%  BMI (Calculated): 29.7    Physical Exam Vitals reviewed.  Constitutional:      Appearance: Normal appearance.  HENT:     Head: Normocephalic.      Left Ear: There is no impacted cerumen.     Nose: Nose normal.     Mouth/Throat:     Mouth: Mucous membranes are moist.     Pharynx: No posterior oropharyngeal erythema.  Eyes:     Extraocular Movements: Extraocular movements intact.     Pupils: Pupils are equal, round, and reactive to light.  Cardiovascular:     Rate and Rhythm: Regular rhythm.     Chest Wall: PMI is not displaced.     Pulses: Normal pulses.     Heart sounds: Normal heart sounds. No murmur heard. Pulmonary:     Effort: Pulmonary effort is normal.     Breath sounds: Normal air entry. No rhonchi or rales.  Abdominal:     General: Abdomen is flat. Bowel sounds are normal. There is no distension.     Palpations: Abdomen is soft. There is no hepatomegaly, splenomegaly or mass.     Tenderness: There is no abdominal tenderness.  Musculoskeletal:        General: Normal range of motion.     Cervical back: Normal range of motion and neck supple.     Right lower leg: No edema.     Left lower leg: No edema.  Skin:    General: Skin is warm and dry.  Neurological:     General: No focal deficit present.     Mental Status: He is alert and oriented to person, place, and time.     Cranial Nerves: No cranial nerve deficit.     Motor: No weakness.  Psychiatric:        Mood and Affect: Mood normal.        Behavior: Behavior normal.      No results found for any visits on 05/10/23.  Recent Results (from the past 2160 hour([place]))  Comprehensive metabolic panel     Status: None   Collection Time: 04/26/23 12:27 PM  Result Value Ref Range   Glucose 88 70 - 99 mg/dL   BUN 20 8 - 27 mg/dL   Creatinine, Ser 1.61 0.76 - 1.27 mg/dL   eGFR 67 >09 UE/AVW/0.98   BUN/Creatinine Ratio 17 10 - 24   Sodium 141 134 - 144 mmol/L   Potassium 4.6 3.5 - 5.2 mmol/L   Chloride 102 96 - 106 mmol/L   CO2 23 20 - 29 mmol/L   Calcium 9.2 8.6 - 10.2 mg/dL   Total Protein 6.1 6.0 - 8.5 g/dL   Albumin 4.1 3.9 - 4.9 g/dL  Globulin, Total 2.0 1.5  - 4.5 g/dL   Bilirubin Total 0.7 0.0 - 1.2 mg/dL   Alkaline Phosphatase 87 44 - 121 IU/L   AST 14 0 - 40 IU/L   ALT 10 0 - 44 IU/L  Lipid Panel w/o Chol/HDL Ratio     Status: Abnormal   Collection Time: 04/26/23 12:27 PM  Result Value Ref Range   Cholesterol, Total 230 (H) 100 - 199 mg/dL   Triglycerides 474 0 - 149 mg/dL   HDL 60 >25 mg/dL   VLDL Cholesterol Cal 18 5 - 40 mg/dL   LDL Chol Calc (NIH) 956 (H) 0 - 99 mg/dL      Assessment & Plan:  As per problem list  Problem List Items Addressed This Visit       Cardiovascular and Mediastinum   Essential hypertension - Primary   Relevant Medications   tamsulosin (FLOMAX) 0.4 MG CAPS capsule   Other Relevant Orders   CBC With Diff/Platelet   Comprehensive metabolic panel     Other   Hyperlipidemia   Other Visit Diagnoses     BPH associated with nocturia       Relevant Medications   tamsulosin (FLOMAX) 0.4 MG CAPS capsule   Other Relevant Orders   PSA       Return in about 6 weeks (around 06/21/2023) for AWV.   Total time spent: 20 minutes  Luna Fuse, MD  05/10/2023   This document may have been prepared by [facility] Voice Recognition software and as such may include unintentional dictation errors.

## 2023-07-11 ENCOUNTER — Other Ambulatory Visit: Payer: Medicare Other

## 2023-07-12 LAB — CBC WITH DIFF/PLATELET
Basophils Absolute: 0 10*3/uL (ref 0.0–0.2)
Basos: 1 %
EOS (ABSOLUTE): 0.1 10*3/uL (ref 0.0–0.4)
Eos: 1 %
Hematocrit: 48.7 % (ref 37.5–51.0)
Hemoglobin: 15.9 g/dL (ref 13.0–17.7)
Immature Grans (Abs): 0 10*3/uL (ref 0.0–0.1)
Immature Granulocytes: 0 %
Lymphocytes Absolute: 0.9 10*3/uL (ref 0.7–3.1)
Lymphs: 20 %
MCH: 28.5 pg (ref 26.6–33.0)
MCHC: 32.6 g/dL (ref 31.5–35.7)
MCV: 87 fL (ref 79–97)
Monocytes Absolute: 0.3 10*3/uL (ref 0.1–0.9)
Monocytes: 7 %
Neutrophils Absolute: 3.3 10*3/uL (ref 1.4–7.0)
Neutrophils: 71 %
Platelets: 163 10*3/uL (ref 150–450)
RBC: 5.57 x10E6/uL (ref 4.14–5.80)
RDW: 13.1 % (ref 11.6–15.4)
WBC: 4.6 10*3/uL (ref 3.4–10.8)

## 2023-07-12 LAB — COMPREHENSIVE METABOLIC PANEL
ALT: 13 [IU]/L (ref 0–44)
AST: 15 [IU]/L (ref 0–40)
Albumin: 4.1 g/dL (ref 3.9–4.9)
Alkaline Phosphatase: 90 [IU]/L (ref 44–121)
BUN/Creatinine Ratio: 18 (ref 10–24)
BUN: 21 mg/dL (ref 8–27)
Bilirubin Total: 0.7 mg/dL (ref 0.0–1.2)
CO2: 24 mmol/L (ref 20–29)
Calcium: 9.6 mg/dL (ref 8.6–10.2)
Chloride: 102 mmol/L (ref 96–106)
Creatinine, Ser: 1.18 mg/dL (ref 0.76–1.27)
Globulin, Total: 2.2 g/dL (ref 1.5–4.5)
Glucose: 81 mg/dL (ref 70–99)
Potassium: 5 mmol/L (ref 3.5–5.2)
Sodium: 140 mmol/L (ref 134–144)
Total Protein: 6.3 g/dL (ref 6.0–8.5)
eGFR: 68 mL/min/{1.73_m2} (ref >59)

## 2023-07-12 LAB — PSA: Prostate Specific Ag, Serum: 1.8 ng/mL (ref 0.0–4.0)

## 2023-07-19 ENCOUNTER — Encounter: Payer: Self-pay | Admitting: Internal Medicine

## 2023-07-19 ENCOUNTER — Other Ambulatory Visit: Payer: Self-pay

## 2023-07-19 ENCOUNTER — Ambulatory Visit (INDEPENDENT_AMBULATORY_CARE_PROVIDER_SITE_OTHER): Payer: Medicare Other | Admitting: Internal Medicine

## 2023-07-19 VITALS — BP 112/76 | HR 80 | Ht 70.5 in | Wt 214.4 lb

## 2023-07-19 DIAGNOSIS — Z0001 Encounter for general adult medical examination with abnormal findings: Secondary | ICD-10-CM

## 2023-07-19 DIAGNOSIS — J301 Allergic rhinitis due to pollen: Secondary | ICD-10-CM

## 2023-07-19 DIAGNOSIS — I10 Essential (primary) hypertension: Secondary | ICD-10-CM | POA: Diagnosis not present

## 2023-07-19 DIAGNOSIS — E782 Mixed hyperlipidemia: Secondary | ICD-10-CM

## 2023-07-19 MED ORDER — EZETIMIBE 10 MG PO TABS
10.0000 mg | ORAL_TABLET | Freq: Every day | ORAL | 11 refills | Status: DC
Start: 2023-07-19 — End: 2024-04-29

## 2023-07-19 MED ORDER — FEXOFENADINE HCL 180 MG PO TABS
180.0000 mg | ORAL_TABLET | Freq: Every day | ORAL | 2 refills | Status: DC
Start: 2023-07-19 — End: 2023-10-18

## 2023-07-19 MED ORDER — FENOFIBRATE 160 MG PO TABS
160.0000 mg | ORAL_TABLET | Freq: Every day | ORAL | 2 refills | Status: DC
Start: 2023-07-19 — End: 2023-10-14

## 2023-07-19 NOTE — Progress Notes (Signed)
Established Patient Office Visit  Subjective:  Patient ID: James Hinton, male    DOB: 03/25/1956  Age: 67 y.o. MRN: [id number]  Chief Complaint  Patient presents with   Annual Exam    AWV    No new complaints, here for AWV refer to quality metrics and scanned documents.  Shoulder pain has improved with diclofenac gel. Still not taking Nexletol.    No other concerns at this time.   Past Medical History:  Diagnosis Date   BPH (benign prostatic hyperplasia)    Hypercholesterolemia    Hypertension    Intervertebral disc disorder    lumbar region   Kidney stones    Male erectile disorder     No past surgical history on file.  Social History   Socioeconomic History   Marital status: Divorced    Spouse name: Not on file   Number of children: Not on file   Years of education: Not on file   Highest education level: Not on file  Occupational History   Not on file  Tobacco Use   Smoking status: Never   Smokeless tobacco: Never  Vaping Use   Vaping status: Never Used  Substance and Sexual Activity   Alcohol use: Yes    Comment: rare drinking    Drug use: Never   Sexual activity: Yes  Other Topics Concern   Not on file  Social History Narrative   Not on file   Social Drivers of Health   Financial Resource Strain: Low Risk  (07/01/2019)   Received from [facility], [facility]   Overall Financial Resource Strain (CARDIA)    Difficulty of Paying Living Expenses: Not hard at all  Food Insecurity: No Food Insecurity (07/01/2019)   Received from [facility], [facility]   Hunger Vital Sign    Worried About Running Out of Food in the Last Year: Never true    Ran Out of Food in the Last Year: Never true  Transportation Needs: No Transportation Needs (07/01/2019)   Received from [facility], [facility]   [facility] - Transportation    Lack of Transportation (Medical): No    Lack of Transportation (Non-Medical): No  Physical Activity: Not  on file  Stress: Not on file  Social Connections: Unknown (07/01/2019)   Received from [facility], [facility]   Social Connection and Isolation Panel [NHANES]    Frequency of Communication with Friends and Family: Not on file    Frequency of Social Gatherings with Friends and Family: Not on file    Attends Religious Services: Not on file    Active Member of Clubs or Organizations: Not on file    Attends Banker Meetings: Not on file    Marital Status: Married  Intimate Partner Violence: Not At Risk (07/01/2019)   Received from [facility], [facility]   Humiliation, Afraid, Rape, and Kick questionnaire    Fear of Current or Ex-Partner: No    Emotionally Abused: No    Physically Abused: No    Sexually Abused: No    Family History  Problem Relation Age of Onset   Ovarian cancer Sister    Cancer Brother    Heart attack Father     Allergies  Allergen Reactions   Atorvastatin Rash, Hives and Itching   Rosuvastatin Other (See Comments)    Outpatient Medications Prior to Visit  Medication Sig   aspirin EC 81 MG tablet  Take 81 mg by mouth daily. Swallow whole.   b complex vitamins capsule Take 1 capsule by mouth daily.   fexofenadine (ALLEGRA) 180 MG tablet TAKE ONE TABLET BY MOUTH ONCE DAILY FOR ALLERGIES   Omega-3 Fatty Acids (FISH OIL) 1200 MG CAPS Take 1 capsule by mouth in the morning and at bedtime.   pantoprazole (PROTONIX) 40 MG tablet TAKE 1 TABLET BY MOUTH EVERY OTHER DAY   sildenafil (VIAGRA) 100 MG tablet Take 100 mg by mouth daily as needed for erectile dysfunction.   tamsulosin (FLOMAX) 0.4 MG CAPS capsule Take 1 capsule (0.4 mg total) by mouth daily after breakfast.   No facility-administered medications prior to visit.    Review of Systems  Constitutional:  Positive for malaise/fatigue.  HENT: Negative.    Eyes: Negative.   Respiratory: Negative.    Cardiovascular: Negative.   Gastrointestinal: Negative.  Negative for  constipation and diarrhea.  Genitourinary:        Nocturia improved on tamsulosin  Musculoskeletal:  Positive for joint pain (left shoulder).  Skin: Negative.   Neurological: Negative.   Endo/Heme/Allergies: Negative.        Objective:   BP 112/76   Pulse 80   Ht 5' 10.5" (1.791 m)   Wt 214 lb 6.4 oz (97.3 kg)   SpO2 96%   BMI 30.33 kg/m   Vitals:   07/19/23 1358  BP: 112/76  Pulse: 80  Height: 5' 10.5" (1.791 m)  Weight: 214 lb 6.4 oz (97.3 kg)  SpO2: 96%  BMI (Calculated): 30.32    Physical Exam Vitals reviewed.  Constitutional:      Appearance: Normal appearance.  HENT:     Head: Normocephalic.     Left Ear: There is no impacted cerumen.     Nose: Nose normal.     Mouth/Throat:     Mouth: Mucous membranes are moist.     Pharynx: No posterior oropharyngeal erythema.  Eyes:     Extraocular Movements: Extraocular movements intact.     Pupils: Pupils are equal, round, and reactive to light.  Cardiovascular:     Rate and Rhythm: Regular rhythm.     Chest Wall: PMI is not displaced.     Pulses: Normal pulses.     Heart sounds: Normal heart sounds. No murmur heard. Pulmonary:     Effort: Pulmonary effort is normal.     Breath sounds: Normal air entry. No rhonchi or rales.  Abdominal:     General: Abdomen is flat. Bowel sounds are normal. There is no distension.     Palpations: Abdomen is soft. There is no hepatomegaly, splenomegaly or mass.     Tenderness: There is no abdominal tenderness.  Musculoskeletal:        General: Normal range of motion.     Cervical back: Normal range of motion and neck supple.     Right lower leg: No edema.     Left lower leg: No edema.  Skin:    General: Skin is warm and dry.  Neurological:     General: No focal deficit present.     Mental Status: He is alert and oriented to person, place, and time.     Cranial Nerves: No cranial nerve deficit.     Motor: No weakness.  Psychiatric:        Mood and Affect: Mood normal.         Behavior: Behavior normal.      No results found for any visits on 07/19/23.  Recent Results (from the past 2160 hours)  Comprehensive metabolic panel     Status: None   Collection Time: 04/26/23 12:27 PM  Result Value Ref Range   Glucose 88 70 - 99 mg/dL   BUN 20 8 - 27 mg/dL   Creatinine, Ser 1.61 0.76 - 1.27 mg/dL   eGFR 67 >09 UE/AVW/0.98   BUN/Creatinine Ratio 17 10 - 24   Sodium 141 134 - 144 mmol/L   Potassium 4.6 3.5 - 5.2 mmol/L   Chloride 102 96 - 106 mmol/L   CO2 23 20 - 29 mmol/L   Calcium 9.2 8.6 - 10.2 mg/dL   Total Protein 6.1 6.0 - 8.5 g/dL   Albumin 4.1 3.9 - 4.9 g/dL   Globulin, Total 2.0 1.5 - 4.5 g/dL   Bilirubin Total 0.7 0.0 - 1.2 mg/dL   Alkaline Phosphatase 87 44 - 121 IU/L   AST 14 0 - 40 IU/L   ALT 10 0 - 44 IU/L  Lipid Panel w/o Chol/HDL Ratio     Status: Abnormal   Collection Time: 04/26/23 12:27 PM  Result Value Ref Range   Cholesterol, Total 230 (H) 100 - 199 mg/dL   Triglycerides 119 0 - 149 mg/dL   HDL 60 >14 mg/dL   VLDL Cholesterol Cal 18 5 - 40 mg/dL   LDL Chol Calc (NIH) 782 (H) 0 - 99 mg/dL  CBC With Diff/Platelet     Status: None   Collection Time: 07/11/23 11:14 AM  Result Value Ref Range   WBC 4.6 3.4 - 10.8 x10E3/uL   RBC 5.57 4.14 - 5.80 x10E6/uL   Hemoglobin 15.9 13.0 - 17.7 g/dL   Hematocrit 95.6 21.3 - 51.0 %   MCV 87 79 - 97 fL   MCH 28.5 26.6 - 33.0 pg   MCHC 32.6 31.5 - 35.7 g/dL   RDW 08.6 57.8 - 46.9 %   Platelets 163 150 - 450 x10E3/uL   Neutrophils 71 Not Estab. %   Lymphs 20 Not Estab. %   Monocytes 7 Not Estab. %   Eos 1 Not Estab. %   Basos 1 Not Estab. %   Neutrophils Absolute 3.3 1.4 - 7.0 x10E3/uL   Lymphocytes Absolute 0.9 0.7 - 3.1 x10E3/uL   Monocytes Absolute 0.3 0.1 - 0.9 x10E3/uL   EOS (ABSOLUTE) 0.1 0.0 - 0.4 x10E3/uL   Basophils Absolute 0.0 0.0 - 0.2 x10E3/uL   Immature Granulocytes 0 Not Estab. %   Immature Grans (Abs) 0.0 0.0 - 0.1 x10E3/uL  Comprehensive metabolic panel     Status:  None   Collection Time: 07/11/23 11:14 AM  Result Value Ref Range   Glucose 81 70 - 99 mg/dL   BUN 21 8 - 27 mg/dL   Creatinine, Ser 6.29 0.76 - 1.27 mg/dL   eGFR 68 >52 WU/XLK/4.40   BUN/Creatinine Ratio 18 10 - 24   Sodium 140 134 - 144 mmol/L   Potassium 5.0 3.5 - 5.2 mmol/L   Chloride 102 96 - 106 mmol/L   CO2 24 20 - 29 mmol/L   Calcium 9.6 8.6 - 10.2 mg/dL   Total Protein 6.3 6.0 - 8.5 g/dL   Albumin 4.1 3.9 - 4.9 g/dL   Globulin, Total 2.2 1.5 - 4.5 g/dL   Bilirubin Total 0.7 0.0 - 1.2 mg/dL   Alkaline Phosphatase 90 44 - 121 IU/L   AST 15 0 - 40 IU/L   ALT 13 0 - 44 IU/L  PSA     Status: None  Collection Time: 07/11/23 11:14 AM  Result Value Ref Range   Prostate Specific Ag, Serum 1.8 0.0 - 4.0 ng/mL    Comment: Roche ECLIA methodology. According to the American Urological Association, Serum PSA should decrease and remain at undetectable levels after radical prostatectomy. The AUA defines biochemical recurrence as an initial PSA value 0.2 ng/mL or greater followed by a subsequent confirmatory PSA value 0.2 ng/mL or greater. Values obtained with different assay methods or kits cannot be used interchangeably. Results cannot be interpreted as absolute evidence of the presence or absence of malignant disease.       Assessment & Plan:  As per problem list  Problem List Items Addressed This Visit       Cardiovascular and Mediastinum   Essential hypertension     Other   Hyperlipidemia - Primary    Return in 3 months (on 10/17/2023).   Total time spent: 20 minutes  Luna Fuse, MD  07/19/2023   This document may have been prepared by [facility] Voice Recognition software and as such may include unintentional dictation errors.

## 2023-10-09 ENCOUNTER — Other Ambulatory Visit

## 2023-10-10 LAB — LIPID PANEL
Chol/HDL Ratio: 2.2 ratio (ref 0.0–5.0)
Cholesterol, Total: 150 mg/dL (ref 100–199)
HDL: 67 mg/dL (ref >39)
LDL Chol Calc (NIH): 70 mg/dL (ref 0–99)
Triglycerides: 66 mg/dL (ref 0–149)
VLDL Cholesterol Cal: 13 mg/dL (ref 5–40)

## 2023-10-12 ENCOUNTER — Other Ambulatory Visit: Payer: Self-pay | Admitting: Internal Medicine

## 2023-10-12 DIAGNOSIS — K2100 Gastro-esophageal reflux disease with esophagitis, without bleeding: Secondary | ICD-10-CM

## 2023-10-13 ENCOUNTER — Other Ambulatory Visit: Payer: Self-pay | Admitting: Internal Medicine

## 2023-10-13 DIAGNOSIS — E782 Mixed hyperlipidemia: Secondary | ICD-10-CM

## 2023-10-18 ENCOUNTER — Encounter: Payer: Self-pay | Admitting: Internal Medicine

## 2023-10-18 ENCOUNTER — Ambulatory Visit: Payer: Medicare Other | Admitting: Internal Medicine

## 2023-10-18 VITALS — BP 122/76 | HR 58 | Temp 97.5°F | Ht 70.5 in | Wt 215.0 lb

## 2023-10-18 DIAGNOSIS — I7300 Raynaud's syndrome without gangrene: Secondary | ICD-10-CM

## 2023-10-18 DIAGNOSIS — J301 Allergic rhinitis due to pollen: Secondary | ICD-10-CM

## 2023-10-18 DIAGNOSIS — I10 Essential (primary) hypertension: Secondary | ICD-10-CM | POA: Diagnosis not present

## 2023-10-18 DIAGNOSIS — R351 Nocturia: Secondary | ICD-10-CM

## 2023-10-18 DIAGNOSIS — N401 Enlarged prostate with lower urinary tract symptoms: Secondary | ICD-10-CM | POA: Diagnosis not present

## 2023-10-18 DIAGNOSIS — Z8709 Personal history of other diseases of the respiratory system: Secondary | ICD-10-CM

## 2023-10-18 DIAGNOSIS — E782 Mixed hyperlipidemia: Secondary | ICD-10-CM

## 2023-10-18 DIAGNOSIS — M349 Systemic sclerosis, unspecified: Secondary | ICD-10-CM

## 2023-10-18 MED ORDER — TAMSULOSIN HCL 0.4 MG PO CAPS: 0.4000 mg | ORAL_CAPSULE | Freq: Every day | ORAL | 1 refills | Status: DC

## 2023-10-18 MED ORDER — FEXOFENADINE HCL 180 MG PO TABS
180.0000 mg | ORAL_TABLET | Freq: Every day | ORAL | 2 refills | Status: DC
Start: 2023-10-18 — End: 2024-01-31

## 2023-10-18 NOTE — Progress Notes (Signed)
 Established Patient Office Visit  Subjective:  Patient ID: James Hinton, male    DOB: 26-Sep-1955  Age: 68 y.o. MRN: [id number]  Chief Complaint  Patient presents with   Follow-up    3 Month Follow up    No new complaints, here for lab review and medication refills. LDL and TC well controlled on lab review. Triglycerides also satisfactory.    No other concerns at this time.   Past Medical History:  Diagnosis Date   BPH (benign prostatic hyperplasia)    Hypercholesterolemia    Hypertension    Intervertebral disc disorder    lumbar region   Kidney stones    Male erectile disorder     No past surgical history on file.  Social History   Socioeconomic History   Marital status: Divorced    Spouse name: Not on file   Number of children: Not on file   Years of education: Not on file   Highest education level: Not on file  Occupational History   Not on file  Tobacco Use   Smoking status: Never   Smokeless tobacco: Never  Vaping Use   Vaping status: Never Used  Substance and Sexual Activity   Alcohol use: Yes    Comment: rare drinking    Drug use: Never   Sexual activity: Yes  Other Topics Concern   Not on file  Social History Narrative   Not on file   Social Drivers of Health   Financial Resource Strain: Low Risk  (07/01/2019)   Received from [facility], [facility]   Overall Financial Resource Strain (CARDIA)    Difficulty of Paying Living Expenses: Not hard at all  Food Insecurity: No Food Insecurity (07/01/2019)   Received from [facility], [facility]   Hunger Vital Sign    Worried About Running Out of Food in the Last Year: Never true    Ran Out of Food in the Last Year: Never true  Transportation Needs: No Transportation Needs (07/01/2019)   Received from [facility], [facility]   [facility] - Transportation    Lack of Transportation (Medical): No    Lack of Transportation (Non-Medical): No  Physical Activity: Not on  file  Stress: Not on file  Social Connections: Unknown (07/01/2019)   Received from [facility], [facility]   Social Connection and Isolation Panel [NHANES]    Frequency of Communication with Friends and Family: Not on file    Frequency of Social Gatherings with Friends and Family: Not on file    Attends Religious Services: Not on file    Active Member of Clubs or Organizations: Not on file    Attends Banker Meetings: Not on file    Marital Status: Married  Intimate Partner Violence: Not At Risk (07/01/2019)   Received from [facility]   Humiliation, Afraid, Rape, and Kick questionnaire    Fear of Current or Ex-Partner: No    Emotionally Abused: No    Physically Abused: No    Sexually Abused: No    Family History  Problem Relation Age of Onset   Ovarian cancer Sister    Cancer Brother    Heart attack Father     Allergies  Allergen Reactions   Atorvastatin Rash, Hives and Itching   Rosuvastatin Other (See Comments)    Outpatient Medications Prior to Visit  Medication Sig   pantoprazole (PROTONIX) 20 MG tablet Take 1  tablet by mouth daily.   aspirin EC 81 MG tablet Take 81 mg by mouth daily. Swallow whole.   b complex vitamins capsule Take 1 capsule by mouth daily.   ezetimibe (ZETIA) 10 MG tablet Take 1 tablet (10 mg total) by mouth daily.   fenofibrate 160 MG tablet TAKE 1 TABLET BY MOUTH EVERY DAY   Omega-3 Fatty Acids (FISH OIL) 1200 MG CAPS Take 1 capsule by mouth in the morning and at bedtime.   sildenafil (VIAGRA) 100 MG tablet Take 100 mg by mouth daily as needed for erectile dysfunction.   [DISCONTINUED] fexofenadine (ALLEGRA) 180 MG tablet Take 1 tablet (180 mg total) by mouth daily.   [DISCONTINUED] pantoprazole (PROTONIX) 40 MG tablet TAKE 1 TABLET BY MOUTH EVERY OTHER DAY   [DISCONTINUED] tamsulosin (FLOMAX) 0.4 MG CAPS capsule Take 1 capsule (0.4 mg total) by mouth daily after breakfast.   No facility-administered  medications prior to visit.    Review of Systems  Constitutional:  Positive for malaise/fatigue.  HENT: Negative.    Eyes: Negative.   Respiratory: Negative.    Cardiovascular: Negative.   Gastrointestinal: Negative.  Negative for constipation and diarrhea.  Genitourinary:        Nocturia improved on tamsulosin  Musculoskeletal:  Positive for joint pain (left shoulder).  Skin: Negative.   Neurological: Negative.   Endo/Heme/Allergies: Negative.        Objective:   BP 122/76   Pulse (!) 58   Temp (!) 97.5 F (36.4 C) (Tympanic)   Ht 5' 10.5" (1.791 m)   Wt 215 lb (97.5 kg)   SpO2 97%   BMI 30.41 kg/m   Vitals:   10/18/23 1053  BP: 122/76  Pulse: (!) 58  Temp: (!) 97.5 F (36.4 C)  Height: 5' 10.5" (1.791 m)  Weight: 215 lb (97.5 kg)  SpO2: 97%  TempSrc: Tympanic  BMI (Calculated): 30.4    Physical Exam Vitals reviewed.  Constitutional:      Appearance: Normal appearance.  HENT:     Head: Normocephalic.     Left Ear: There is no impacted cerumen.     Nose: Nose normal.     Mouth/Throat:     Mouth: Mucous membranes are moist.     Pharynx: No posterior oropharyngeal erythema.  Eyes:     Extraocular Movements: Extraocular movements intact.     Pupils: Pupils are equal, round, and reactive to light.  Cardiovascular:     Rate and Rhythm: Regular rhythm.     Chest Wall: PMI is not displaced.     Pulses: Normal pulses.     Heart sounds: Normal heart sounds. No murmur heard. Pulmonary:     Effort: Pulmonary effort is normal.     Breath sounds: Normal air entry. No rhonchi or rales.  Abdominal:     General: Abdomen is flat. Bowel sounds are normal. There is no distension.     Palpations: Abdomen is soft. There is no hepatomegaly, splenomegaly or mass.     Tenderness: There is no abdominal tenderness.  Musculoskeletal:        General: Normal range of motion.     Cervical back: Normal range of motion and neck supple.     Right lower leg: No edema.      Left lower leg: No edema.  Skin:    General: Skin is warm and dry.  Neurological:     General: No focal deficit present.     Mental Status: He is alert and oriented  to person, place, and time.     Cranial Nerves: No cranial nerve deficit.     Motor: No weakness.  Psychiatric:        Mood and Affect: Mood normal.        Behavior: Behavior normal.      No results found for any visits on 10/18/23.  Recent Results (from the past 2160 hours)  Lipid panel     Status: None   Collection Time: 10/09/23  8:54 AM  Result Value Ref Range   Cholesterol, Total 150 100 - 199 mg/dL   Triglycerides 66 0 - 149 mg/dL   HDL 67 >95 mg/dL   VLDL Cholesterol Cal 13 5 - 40 mg/dL   LDL Chol Calc (NIH) 70 0 - 99 mg/dL   Chol/HDL Ratio 2.2 0.0 - 5.0 ratio    Comment:                                   T. Chol/HDL Ratio                                             Men  Women                               1/2 Avg.Risk  3.4    3.3                                   Avg.Risk  5.0    4.4                                2X Avg.Risk  9.6    7.1                                3X Avg.Risk 23.4   11.0       Assessment & Plan:  As per problem list  Problem List Items Addressed This Visit       Cardiovascular and Mediastinum   Essential hypertension     Respiratory   Allergic rhinitis due to pollen   Relevant Medications   fexofenadine (ALLEGRA) 180 MG tablet     Other   Scleroderma (HCC)   Hyperlipidemia - Primary   Relevant Orders   Lipid panel   Hepatic function panel   Other Visit Diagnoses       BPH associated with nocturia       Relevant Medications   tamsulosin (FLOMAX) 0.4 MG CAPS capsule     [place] phenomenon without gangrene         History of pulmonary fibrosis           Return in about 3 months (around 01/18/2024) for fu with labs prior.   Total time spent: 20 minutes  Luna Fuse, MD  10/18/2023   This document may have been prepared by [facility] Voice  Recognition software and as such may include unintentional dictation errors.

## 2023-10-25 ENCOUNTER — Ambulatory Visit: Payer: Medicare Other | Admitting: Internal Medicine

## 2024-01-12 ENCOUNTER — Other Ambulatory Visit: Payer: Self-pay | Admitting: Internal Medicine

## 2024-01-12 DIAGNOSIS — E782 Mixed hyperlipidemia: Secondary | ICD-10-CM

## 2024-01-23 ENCOUNTER — Other Ambulatory Visit

## 2024-01-23 DIAGNOSIS — I10 Essential (primary) hypertension: Secondary | ICD-10-CM

## 2024-01-23 DIAGNOSIS — E782 Mixed hyperlipidemia: Secondary | ICD-10-CM

## 2024-01-23 DIAGNOSIS — R7301 Impaired fasting glucose: Secondary | ICD-10-CM

## 2024-01-23 DIAGNOSIS — E663 Overweight: Secondary | ICD-10-CM

## 2024-01-24 LAB — CMP14+EGFR
ALT: 15 IU/L (ref 0–44)
AST: 17 IU/L (ref 0–40)
Albumin: 4.1 g/dL (ref 3.9–4.9)
Alkaline Phosphatase: 65 IU/L (ref 44–121)
BUN/Creatinine Ratio: 13 (ref 10–24)
BUN: 18 mg/dL (ref 8–27)
Bilirubin Total: 0.6 mg/dL (ref 0.0–1.2)
CO2: 21 mmol/L (ref 20–29)
Calcium: 9.8 mg/dL (ref 8.6–10.2)
Chloride: 103 mmol/L (ref 96–106)
Creatinine, Ser: 1.36 mg/dL — ABNORMAL HIGH (ref 0.76–1.27)
Globulin, Total: 2.2 g/dL (ref 1.5–4.5)
Glucose: 92 mg/dL (ref 70–99)
Potassium: 4.6 mmol/L (ref 3.5–5.2)
Sodium: 140 mmol/L (ref 134–144)
Total Protein: 6.3 g/dL (ref 6.0–8.5)
eGFR: 57 mL/min/{1.73_m2} — ABNORMAL LOW (ref >59)

## 2024-01-24 LAB — TSH: TSH: 1.73 u[IU]/mL (ref 0.450–4.500)

## 2024-01-24 LAB — LIPID PANEL
Chol/HDL Ratio: 2.5 ratio (ref 0.0–5.0)
Cholesterol, Total: 168 mg/dL (ref 100–199)
HDL: 68 mg/dL (ref >39)
LDL Chol Calc (NIH): 88 mg/dL (ref 0–99)
Triglycerides: 61 mg/dL (ref 0–149)
VLDL Cholesterol Cal: 12 mg/dL (ref 5–40)

## 2024-01-24 LAB — HEMOGLOBIN A1C
Est. average glucose Bld gHb Est-mCnc: 108 mg/dL
Hgb A1c MFr Bld: 5.4 % (ref 4.8–5.6)

## 2024-01-31 ENCOUNTER — Encounter: Payer: Self-pay | Admitting: Internal Medicine

## 2024-01-31 ENCOUNTER — Ambulatory Visit: Payer: Self-pay | Admitting: Internal Medicine

## 2024-01-31 ENCOUNTER — Ambulatory Visit (INDEPENDENT_AMBULATORY_CARE_PROVIDER_SITE_OTHER): Admitting: Internal Medicine

## 2024-01-31 VITALS — BP 122/80 | HR 81 | Temp 97.3°F | Ht 70.5 in | Wt 220.8 lb

## 2024-01-31 DIAGNOSIS — J301 Allergic rhinitis due to pollen: Secondary | ICD-10-CM | POA: Diagnosis not present

## 2024-01-31 DIAGNOSIS — L03317 Cellulitis of buttock: Secondary | ICD-10-CM | POA: Insufficient documentation

## 2024-01-31 DIAGNOSIS — E782 Mixed hyperlipidemia: Secondary | ICD-10-CM

## 2024-01-31 DIAGNOSIS — T63461A Toxic effect of venom of wasps, accidental (unintentional), initial encounter: Secondary | ICD-10-CM

## 2024-01-31 DIAGNOSIS — N179 Acute kidney failure, unspecified: Secondary | ICD-10-CM

## 2024-01-31 DIAGNOSIS — I10 Essential (primary) hypertension: Secondary | ICD-10-CM | POA: Diagnosis not present

## 2024-01-31 MED ORDER — CEPHALEXIN 250 MG PO CAPS: 250.0000 mg | ORAL_CAPSULE | Freq: Four times a day (QID) | ORAL | 0 refills | Status: AC

## 2024-01-31 MED ORDER — FLUTICASONE PROPIONATE 50 MCG/ACT NA SUSP
1.0000 | Freq: Every day | NASAL | 2 refills | Status: AC
Start: 2024-01-31 — End: 2024-04-30

## 2024-01-31 MED ORDER — CETIRIZINE HCL 10 MG PO TABS: 10.0000 mg | ORAL_TABLET | Freq: Every day | ORAL | 2 refills | Status: DC

## 2024-01-31 NOTE — Progress Notes (Signed)
 Established Patient Office Visit  Subjective:  Patient ID: James Hinton, male    DOB: April 06, 1956  Age: 68 y.o. MRN: [id number]  Chief Complaint  Patient presents with   Follow-up    3 months lab results    Received multiple stings from a yellow jacket wasp nest and c/o possible furuncle in his buttocks where he was also stung. Labs reviewed and notable for  deterioration in renal function while lipids remained within target.    No other concerns at this time.   Past Medical History:  Diagnosis Date   BPH (benign prostatic hyperplasia)    Hypercholesterolemia    Hypertension    Intervertebral disc disorder    lumbar region   Kidney stones    Male erectile disorder     No past surgical history on file.  Social History   Socioeconomic History   Marital status: Divorced    Spouse name: Not on file   Number of children: Not on file   Years of education: Not on file   Highest education level: Not on file  Occupational History   Not on file  Tobacco Use   Smoking status: Never   Smokeless tobacco: Never  Vaping Use   Vaping status: Never Used  Substance and Sexual Activity   Alcohol use: Yes    Comment: rare drinking    Drug use: Never   Sexual activity: Yes  Other Topics Concern   Not on file  Social History Narrative   Not on file   Social Drivers of Health   Financial Resource Strain: Low Risk  (07/01/2019)   Received from [facility]   Overall Financial Resource Strain (CARDIA)    Difficulty of Paying Living Expenses: Not hard at all  Food Insecurity: No Food Insecurity (07/01/2019)   Received from [facility]   Hunger Vital Sign    Within the past 12 months, you worried that your food would run out before you got the money to buy more.: Never true    Within the past 12 months, the food you bought just didn't last and you didn't have money to get more.: Never true  Transportation Needs: No Transportation Needs (07/01/2019)   Received from  [facility]   PRAPARE - Transportation    Lack of Transportation (Medical): No    Lack of Transportation (Non-Medical): No  Physical Activity: Not on file  Stress: Not on file  Social Connections: Unknown (07/01/2019)   Received from [facility]   Social Connection and Isolation Panel    Frequency of Communication with Friends and Family: Not on file    Frequency of Social Gatherings with Friends and Family: Not on file    Attends Religious Services: Not on file    Active Member of Clubs or Organizations: Not on file    Attends Banker Meetings: Not on file    Are you married, widowed, divorced, separated, never married, or living with a partner?: Married  Intimate Partner Violence: Not At Risk (07/01/2019)   Received from [facility]   Humiliation, Afraid, Rape, and Kick questionnaire    Within the last year, have you been afraid of your partner or ex-partner?: No    Within the last year, have you been humiliated or emotionally abused in other ways by your partner or ex-partner?: No    Within the last year, have you been kicked, hit, slapped, or otherwise physically hurt by your partner or ex-partner?:  No    Within the last year, have you been raped or forced to have any kind of sexual activity by your partner or ex-partner?: No    Family History  Problem Relation Age of Onset   Ovarian cancer Sister    Cancer Brother    Heart attack Father     Allergies  Allergen Reactions   Atorvastatin Rash, Hives and Itching   Rosuvastatin Other (See Comments)    Outpatient Medications Prior to Visit  Medication Sig   aspirin EC 81 MG tablet Take 81 mg by mouth daily. Swallow whole.   b complex vitamins capsule Take 1 capsule by mouth daily.   ezetimibe  (ZETIA ) 10 MG tablet Take 1 tablet (10 mg total) by mouth daily.   fenofibrate  160 MG tablet TAKE 1 TABLET BY MOUTH EVERY DAY   Omega-3 Fatty Acids (FISH OIL) 1200 MG CAPS Take 1 capsule by mouth in the morning  and at bedtime.   pantoprazole (PROTONIX) 20 MG tablet Take 1 tablet by mouth daily.   sildenafil (VIAGRA) 100 MG tablet Take 100 mg by mouth daily as needed for erectile dysfunction.   tamsulosin  (FLOMAX ) 0.4 MG CAPS capsule Take 1 capsule (0.4 mg total) by mouth daily after breakfast.   [DISCONTINUED] fexofenadine  (ALLEGRA ) 180 MG tablet Take 1 tablet (180 mg total) by mouth daily.   No facility-administered medications prior to visit.    Review of Systems  HENT: Negative.    Eyes: Negative.   Respiratory: Negative.    Cardiovascular: Negative.   Gastrointestinal: Negative.  Negative for constipation and diarrhea.  Genitourinary:        Nocturia improved on tamsulosin   Musculoskeletal:  Positive for joint pain (left shoulder).  Skin: Negative.   Neurological: Negative.   Endo/Heme/Allergies: Negative.        Objective:   BP 122/80   Pulse 81   Temp (!) 97.3 F (36.3 C)   Ht 5' 10.5 (1.791 m)   Wt 220 lb 12.8 oz (100.2 kg)   SpO2 97%   BMI 31.23 kg/m   Vitals:   01/31/24 0946  BP: 122/80  Pulse: 81  Temp: (!) 97.3 F (36.3 C)  Height: 5' 10.5 (1.791 m)  Weight: 220 lb 12.8 oz (100.2 kg)  SpO2: 97%  BMI (Calculated): 31.22    Physical Exam Vitals reviewed.  Constitutional:      Appearance: Normal appearance.  HENT:     Head: Normocephalic.     Left Ear: There is no impacted cerumen.     Nose: Nose normal.     Mouth/Throat:     Mouth: Mucous membranes are moist.     Pharynx: No posterior oropharyngeal erythema.   Eyes:     Extraocular Movements: Extraocular movements intact.     Pupils: Pupils are equal, round, and reactive to light.    Cardiovascular:     Rate and Rhythm: Regular rhythm.     Chest Wall: PMI is not displaced.     Pulses: Normal pulses.     Heart sounds: Normal heart sounds. No murmur heard. Pulmonary:     Effort: Pulmonary effort is normal.     Breath sounds: Normal air entry. No rhonchi or rales.  Abdominal:     General:  Abdomen is flat. Bowel sounds are normal. There is no distension.     Palpations: Abdomen is soft. There is no hepatomegaly, splenomegaly or mass.     Tenderness: There is no abdominal tenderness.   Musculoskeletal:  General: Normal range of motion.     Cervical back: Normal range of motion and neck supple.     Right lower leg: No edema.     Left lower leg: No edema.   Skin:    General: Skin is warm and dry.     Comments: Cellulitis of his right buttock   Neurological:     General: No focal deficit present.     Mental Status: He is alert and oriented to person, place, and time.     Cranial Nerves: No cranial nerve deficit.     Motor: No weakness.   Psychiatric:        Mood and Affect: Mood normal.        Behavior: Behavior normal.      No results found for any visits on 01/31/24.  Recent Results (from the past 2160 hours)  Hemoglobin A1c     Status: None   Collection Time: 01/23/24  9:08 AM  Result Value Ref Range   Hgb A1c MFr Bld 5.4 4.8 - 5.6 %    Comment:          Prediabetes: 5.7 - 6.4          Diabetes: >6.4          Glycemic control for adults with diabetes: <7.0    Est. average glucose Bld gHb Est-mCnc 108 mg/dL  TSH     Status: None   Collection Time: 01/23/24  9:08 AM  Result Value Ref Range   TSH 1.730 0.450 - 4.500 uIU/mL  CMP14+EGFR     Status: Abnormal   Collection Time: 01/23/24  9:08 AM  Result Value Ref Range   Glucose 92 70 - 99 mg/dL   BUN 18 8 - 27 mg/dL   Creatinine, Ser 8.63 (H) 0.76 - 1.27 mg/dL   eGFR 57 (L) >40 fO/fpw/8.26   BUN/Creatinine Ratio 13 10 - 24   Sodium 140 134 - 144 mmol/L   Potassium 4.6 3.5 - 5.2 mmol/L   Chloride 103 96 - 106 mmol/L   CO2 21 20 - 29 mmol/L   Calcium 9.8 8.6 - 10.2 mg/dL   Total Protein 6.3 6.0 - 8.5 g/dL   Albumin 4.1 3.9 - 4.9 g/dL   Globulin, Total 2.2 1.5 - 4.5 g/dL   Bilirubin Total 0.6 0.0 - 1.2 mg/dL   Alkaline Phosphatase 65 44 - 121 IU/L   AST 17 0 - 40 IU/L   ALT 15 0 - 44 IU/L   Lipid panel     Status: None   Collection Time: 01/23/24  9:08 AM  Result Value Ref Range   Cholesterol, Total 168 100 - 199 mg/dL   Triglycerides 61 0 - 149 mg/dL   HDL 68 >60 mg/dL   VLDL Cholesterol Cal 12 5 - 40 mg/dL   LDL Chol Calc (NIH) 88 0 - 99 mg/dL   Chol/HDL Ratio 2.5 0.0 - 5.0 ratio    Comment:                                   T. Chol/HDL Ratio                                             Men  Women  1/2 Avg.Risk  3.4    3.3                                   Avg.Risk  5.0    4.4                                2X Avg.Risk  9.6    7.1                                3X Avg.Risk 23.4   11.0       Assessment & Plan:  As per problem list. Encouraged to drink at least 2L of fluids/day.  Problem List Items Addressed This Visit       Cardiovascular and Mediastinum   Essential hypertension     Respiratory   Allergic rhinitis due to pollen   Relevant Medications   fluticasone (FLONASE) 50 MCG/ACT nasal spray   cetirizine (ZYRTEC ALLERGY) 10 MG tablet     Other   Hyperlipidemia - Primary   Relevant Orders   Lipid panel   Cellulitis of buttock   Relevant Medications   cephALEXin (KEFLEX) 250 MG capsule   Other Visit Diagnoses       Wasp sting, accidental or unintentional, initial encounter         AKI (acute kidney injury) (HCC)       Relevant Orders   Comprehensive metabolic panel with GFR       Return in about 3 months (around 05/02/2024) for fu with labs prior.   Total time spent: 30 minutes  Sherrill Cinderella Perry, MD  01/31/2024   This document may have been prepared by [facility] Voice Recognition software and as such may include unintentional dictation errors.

## 2024-02-06 ENCOUNTER — Ambulatory Visit: Admitting: Cardiology

## 2024-02-06 ENCOUNTER — Encounter: Payer: Self-pay | Admitting: Cardiology

## 2024-02-06 ENCOUNTER — Other Ambulatory Visit: Payer: Self-pay | Admitting: Cardiology

## 2024-02-06 VITALS — BP 119/72 | HR 77 | Ht 70.5 in | Wt 220.0 lb

## 2024-02-06 DIAGNOSIS — I10 Essential (primary) hypertension: Secondary | ICD-10-CM | POA: Diagnosis not present

## 2024-02-06 DIAGNOSIS — T63461D Toxic effect of venom of wasps, accidental (unintentional), subsequent encounter: Secondary | ICD-10-CM | POA: Diagnosis not present

## 2024-02-06 DIAGNOSIS — T63461A Toxic effect of venom of wasps, accidental (unintentional), initial encounter: Secondary | ICD-10-CM | POA: Insufficient documentation

## 2024-02-06 DIAGNOSIS — L03317 Cellulitis of buttock: Secondary | ICD-10-CM | POA: Diagnosis not present

## 2024-02-06 MED ORDER — MUPIROCIN CALCIUM 2 % EX CREA: 1.0000 | TOPICAL_CREAM | Freq: Two times a day (BID) | CUTANEOUS | 0 refills | Status: DC

## 2024-02-06 MED ORDER — MUPIROCIN 2 % EX OINT: 1.0000 | TOPICAL_OINTMENT | Freq: Two times a day (BID) | CUTANEOUS | 0 refills | Status: AC

## 2024-02-06 MED ORDER — CEPHALEXIN 500 MG PO CAPS: 500.0000 mg | ORAL_CAPSULE | Freq: Two times a day (BID) | ORAL | 0 refills | Status: AC

## 2024-02-06 NOTE — Progress Notes (Signed)
 Established Patient Office Visit  Subjective:  Patient ID: James Hinton, male    DOB: [birth year]  Age: 68 y.o. MRN: [id number]  Chief Complaint  Patient presents with   Acute Visit    Bee stings. Finished antibiotics for them but still has a few he is worried are infected.     Patient in office for an acute visit. Patient seen on 01/31/2024 by regular PCP for regular follow up. At appointment, patient noted to have multiple stings from a yellow jacket nest and c/o possible furuncle in his buttocks where he was also stung. Patient completed 5 day course of cephalexin. Patient presents today with concerns of infection on left buttock where he was stung by yellow jackets.  In office today on exam, two open wounds noted on left buttock with yellow eschar. Will send cephalexin to the pharmacy. Recommend using Bactroban topically.  Recommend recheck in one week with regular provider.     No other concerns at this time.   Past Medical History:  Diagnosis Date   BPH (benign prostatic hyperplasia)    Hypercholesterolemia    Hypertension    Intervertebral disc disorder    lumbar region   Kidney stones    Male erectile disorder     History reviewed. No pertinent surgical history.  Social History   Socioeconomic History   Marital status: Divorced    Spouse name: Not on file   Number of children: Not on file   Years of education: Not on file   Highest education level: Not on file  Occupational History   Not on file  Tobacco Use   Smoking status: Never   Smokeless tobacco: Never  Vaping Use   Vaping status: Never Used  Substance and Sexual Activity   Alcohol use: Yes    Comment: rare drinking    Drug use: Never   Sexual activity: Yes  Other Topics Concern   Not on file  Social History Narrative   Not on file   Social Drivers of Health   Financial Resource Strain: Low Risk  (07/01/2019)   Received from [facility]   Overall Financial Resource Strain (CARDIA)     Difficulty of Paying Living Expenses: Not hard at all  Food Insecurity: No Food Insecurity (07/01/2019)   Received from [facility]   Hunger Vital Sign    Within the past 12 months, you worried that your food would run out before you got the money to buy more.: Never true    Within the past 12 months, the food you bought just didn't last and you didn't have money to get more.: Never true  Transportation Needs: No Transportation Needs (07/01/2019)   Received from [facility]   PRAPARE - Transportation    Lack of Transportation (Medical): No    Lack of Transportation (Non-Medical): No  Physical Activity: Not on file  Stress: Not on file  Social Connections: Unknown (07/01/2019)   Received from [facility]   Social Connection and Isolation Panel    Frequency of Communication with Friends and Family: Not on file    Frequency of Social Gatherings with Friends and Family: Not on file    Attends Religious Services: Not on file    Active Member of Clubs or Organizations: Not on file    Attends Banker Meetings: Not on file    Are you married, widowed, divorced, separated, never married, or living with a partner?: Married  Intimate Partner Violence:  Not At Risk (07/01/2019)   Received from [facility]   Humiliation, Afraid, Rape, and Kick questionnaire    Within the last year, have you been afraid of your partner or ex-partner?: No    Within the last year, have you been humiliated or emotionally abused in other ways by your partner or ex-partner?: No    Within the last year, have you been kicked, hit, slapped, or otherwise physically hurt by your partner or ex-partner?: No    Within the last year, have you been raped or forced to have any kind of sexual activity by your partner or ex-partner?: No    Family History  Problem Relation Age of Onset   Ovarian cancer Sister    Cancer Brother    Heart attack Father     Allergies  Allergen Reactions    Atorvastatin Rash, Hives and Itching   Rosuvastatin Other (See Comments)    Outpatient Medications Prior to Visit  Medication Sig   aspirin EC 81 MG tablet Take 81 mg by mouth daily. Swallow whole.   b complex vitamins capsule Take 1 capsule by mouth daily.   cetirizine (ZYRTEC ALLERGY) 10 MG tablet Take 1 tablet (10 mg total) by mouth daily.   ezetimibe  (ZETIA ) 10 MG tablet Take 1 tablet (10 mg total) by mouth daily.   fenofibrate  160 MG tablet TAKE 1 TABLET BY MOUTH EVERY DAY   fluticasone (FLONASE) 50 MCG/ACT nasal spray Place 1 spray into both nostrils daily.   Omega-3 Fatty Acids (FISH OIL) 1200 MG CAPS Take 1 capsule by mouth in the morning and at bedtime.   pantoprazole (PROTONIX) 20 MG tablet Take 1 tablet by mouth daily.   sildenafil (VIAGRA) 100 MG tablet Take 100 mg by mouth daily as needed for erectile dysfunction.   tamsulosin  (FLOMAX ) 0.4 MG CAPS capsule Take 1 capsule (0.4 mg total) by mouth daily after breakfast.   No facility-administered medications prior to visit.    Review of Systems  Constitutional: Negative.   HENT: Negative.    Eyes: Negative.   Respiratory: Negative.  Negative for shortness of breath.   Cardiovascular: Negative.  Negative for chest pain.  Gastrointestinal: Negative.  Negative for abdominal pain, constipation and diarrhea.  Genitourinary: Negative.   Musculoskeletal:  Negative for joint pain and myalgias.  Skin: Negative.   Neurological: Negative.  Negative for dizziness and headaches.  Endo/Heme/Allergies: Negative.   All other systems reviewed and are negative.      Objective:   BP 119/72   Pulse 77   Ht 5' 10.5 (1.791 m)   Wt 220 lb (99.8 kg)   SpO2 96%   BMI 31.12 kg/m   Vitals:   02/06/24 1134  BP: 119/72  Pulse: 77  Height: 5' 10.5 (1.791 m)  Weight: 220 lb (99.8 kg)  SpO2: 96%  BMI (Calculated): 31.11    Physical Exam Nursing note reviewed.  Constitutional:      Appearance: Normal appearance. He is normal  weight.  HENT:     Head: Normocephalic and atraumatic.     Nose: Nose normal.     Mouth/Throat:     Mouth: Mucous membranes are moist.     Pharynx: Oropharynx is clear.  Eyes:     Extraocular Movements: Extraocular movements intact.     Conjunctiva/sclera: Conjunctivae normal.     Pupils: Pupils are equal, round, and reactive to light.  Cardiovascular:     Rate and Rhythm: Normal rate and regular rhythm.  Pulses: Normal pulses.     Heart sounds: Normal heart sounds.  Pulmonary:     Effort: Pulmonary effort is normal.     Breath sounds: Normal breath sounds.  Abdominal:     General: Abdomen is flat. Bowel sounds are normal.     Palpations: Abdomen is soft.  Musculoskeletal:        General: Normal range of motion.     Cervical back: Normal range of motion.  Skin:    General: Skin is warm and dry.     Findings: Wound present.      Neurological:     General: No focal deficit present.     Mental Status: He is alert and oriented to person, place, and time.  Psychiatric:        Mood and Affect: Mood normal.        Behavior: Behavior normal.        Thought Content: Thought content normal.        Judgment: Judgment normal.      No results found for any visits on 02/06/24.  Recent Results (from the past 2160 hours)  Hemoglobin A1c     Status: None   Collection Time: 01/23/24  9:08 AM  Result Value Ref Range   Hgb A1c MFr Bld 5.4 4.8 - 5.6 %    Comment:          Prediabetes: 5.7 - 6.4          Diabetes: >6.4          Glycemic control for adults with diabetes: <7.0    Est. average glucose Bld gHb Est-mCnc 108 mg/dL  TSH     Status: None   Collection Time: 01/23/24  9:08 AM  Result Value Ref Range   TSH 1.730 0.450 - 4.500 uIU/mL  CMP14+EGFR     Status: Abnormal   Collection Time: 01/23/24  9:08 AM  Result Value Ref Range   Glucose 92 70 - 99 mg/dL   BUN 18 8 - 27 mg/dL   Creatinine, Ser 8.63 (H) 0.76 - 1.27 mg/dL   eGFR 57 (L) >40 fO/fpw/8.26   BUN/Creatinine  Ratio 13 10 - 24   Sodium 140 134 - 144 mmol/L   Potassium 4.6 3.5 - 5.2 mmol/L   Chloride 103 96 - 106 mmol/L   CO2 21 20 - 29 mmol/L   Calcium 9.8 8.6 - 10.2 mg/dL   Total Protein 6.3 6.0 - 8.5 g/dL   Albumin 4.1 3.9 - 4.9 g/dL   Globulin, Total 2.2 1.5 - 4.5 g/dL   Bilirubin Total 0.6 0.0 - 1.2 mg/dL   Alkaline Phosphatase 65 44 - 121 IU/L   AST 17 0 - 40 IU/L   ALT 15 0 - 44 IU/L  Lipid panel     Status: None   Collection Time: 01/23/24  9:08 AM  Result Value Ref Range   Cholesterol, Total 168 100 - 199 mg/dL   Triglycerides 61 0 - 149 mg/dL   HDL 68 >60 mg/dL   VLDL Cholesterol Cal 12 5 - 40 mg/dL   LDL Chol Calc (NIH) 88 0 - 99 mg/dL   Chol/HDL Ratio 2.5 0.0 - 5.0 ratio    Comment:                                   T. Chol/HDL Ratio  Men  Women                               1/2 Avg.Risk  3.4    3.3                                   Avg.Risk  5.0    4.4                                2X Avg.Risk  9.6    7.1                                3X Avg.Risk 23.4   11.0       Assessment & Plan:  Cephalexin Bactroban   Problem List Items Addressed This Visit       Other   Yellow jacket sting - Primary    Return in about 1 week (around 02/13/2024) for with TJ.   Total time spent: 25 minutes  [employer], NP  02/06/2024   This document may have been prepared by Dragon Voice Recognition software and as such may include unintentional dictation errors.

## 2024-02-26 ENCOUNTER — Other Ambulatory Visit: Payer: Self-pay | Admitting: Internal Medicine

## 2024-02-26 ENCOUNTER — Other Ambulatory Visit

## 2024-02-27 LAB — BMP8+ANION GAP
Anion Gap: 13 mmol/L (ref 10.0–18.0)
BUN/Creatinine Ratio: 17 (ref 10–24)
BUN: 21 mg/dL (ref 8–27)
CO2: 21 mmol/L (ref 20–29)
Calcium: 9 mg/dL (ref 8.6–10.2)
Chloride: 106 mmol/L (ref 96–106)
Creatinine, Ser: 1.27 mg/dL (ref 0.76–1.27)
Glucose: 94 mg/dL (ref 70–99)
Potassium: 4.3 mmol/L (ref 3.5–5.2)
Sodium: 140 mmol/L (ref 134–144)
eGFR: 62 mL/min/1.73 (ref >59)

## 2024-02-27 LAB — COMPREHENSIVE METABOLIC PANEL WITH GFR
ALT: 15 IU/L (ref 0–44)
AST: 19 IU/L (ref 0–40)
Albumin: 3.8 g/dL — ABNORMAL LOW (ref 3.9–4.9)
Alkaline Phosphatase: 60 IU/L (ref 44–121)
Bilirubin Total: 0.6 mg/dL (ref 0.0–1.2)
Globulin, Total: 2.3 g/dL (ref 1.5–4.5)
Total Protein: 6.1 g/dL (ref 6.0–8.5)

## 2024-03-06 ENCOUNTER — Encounter: Payer: Self-pay | Admitting: Internal Medicine

## 2024-03-06 ENCOUNTER — Ambulatory Visit (INDEPENDENT_AMBULATORY_CARE_PROVIDER_SITE_OTHER): Admitting: Internal Medicine

## 2024-03-06 ENCOUNTER — Ambulatory Visit: Payer: Self-pay | Admitting: Internal Medicine

## 2024-03-06 VITALS — BP 116/72 | HR 75 | Temp 98.5°F | Ht 70.0 in | Wt 224.6 lb

## 2024-03-06 DIAGNOSIS — T63461D Toxic effect of venom of wasps, accidental (unintentional), subsequent encounter: Secondary | ICD-10-CM

## 2024-03-06 DIAGNOSIS — N179 Acute kidney failure, unspecified: Secondary | ICD-10-CM

## 2024-03-06 DIAGNOSIS — Z0130 Encounter for examination of blood pressure without abnormal findings: Secondary | ICD-10-CM

## 2024-03-06 DIAGNOSIS — E782 Mixed hyperlipidemia: Secondary | ICD-10-CM | POA: Diagnosis not present

## 2024-03-06 NOTE — Progress Notes (Signed)
 Established Patient Office Visit  Subjective:  Patient ID: James Hinton, male    DOB: [birth year]  Age: 68 y.o. MRN: [id number]  Chief Complaint  Patient presents with   Follow-up    1 month lab results    No new complaints, here for lab review  for follow up of AKI. Fully recovered from bee stings, labs reviewed and notable for normalization of renal function.     No other concerns at this time.   Past Medical History:  Diagnosis Date   BPH (benign prostatic hyperplasia)    Hypercholesterolemia    Hypertension    Intervertebral disc disorder    lumbar region   Kidney stones    Male erectile disorder     No past surgical history on file.  Social History   Socioeconomic History   Marital status: Divorced    Spouse name: Not on file   Number of children: Not on file   Years of education: Not on file   Highest education level: Not on file  Occupational History   Not on file  Tobacco Use   Smoking status: Never   Smokeless tobacco: Never  Vaping Use   Vaping status: Never Used  Substance and Sexual Activity   Alcohol use: Yes    Comment: rare drinking    Drug use: Never   Sexual activity: Yes  Other Topics Concern   Not on file  Social History Narrative   Not on file   Social Drivers of Health   Financial Resource Strain: Low Risk  (07/01/2019)   Received from [facility]   Overall Financial Resource Strain (CARDIA)    Difficulty of Paying Living Expenses: Not hard at all  Food Insecurity: No Food Insecurity (07/01/2019)   Received from [facility]   Hunger Vital Sign    Within the past 12 months, you worried that your food would run out before you got the money to buy more.: Never true    Within the past 12 months, the food you bought just didn't last and you didn't have money to get more.: Never true  Transportation Needs: No Transportation Needs (07/01/2019)   Received from [facility]   PRAPARE - Transportation    Lack of  Transportation (Medical): No    Lack of Transportation (Non-Medical): No  Physical Activity: Not on file  Stress: Not on file  Social Connections: Unknown (07/01/2019)   Received from [facility]   Social Connection and Isolation Panel    Frequency of Communication with Friends and Family: Not on file    Frequency of Social Gatherings with Friends and Family: Not on file    Attends Religious Services: Not on file    Active Member of Clubs or Organizations: Not on file    Attends Banker Meetings: Not on file    Are you married, widowed, divorced, separated, never married, or living with a partner?: Married  Intimate Partner Violence: Not At Risk (07/01/2019)   Received from [facility]   Humiliation, Afraid, Rape, and Kick questionnaire    Within the last year, have you been afraid of your partner or ex-partner?: No    Within the last year, have you been humiliated or emotionally abused in other ways by your partner or ex-partner?: No    Within the last year, have you been kicked, hit, slapped, or otherwise physically hurt by your partner or ex-partner?: No    Within the last year,  have you been raped or forced to have any kind of sexual activity by your partner or ex-partner?: No    Family History  Problem Relation Age of Onset   Ovarian cancer Sister    Cancer Brother    Heart attack Father     Allergies  Allergen Reactions   Atorvastatin Rash, Hives and Itching   Rosuvastatin Other (See Comments)    Outpatient Medications Prior to Visit  Medication Sig   aspirin EC 81 MG tablet Take 81 mg by mouth daily. Swallow whole.   b complex vitamins capsule Take 1 capsule by mouth daily.   cetirizine  (ZYRTEC  ALLERGY) 10 MG tablet Take 1 tablet (10 mg total) by mouth daily.   ezetimibe  (ZETIA ) 10 MG tablet Take 1 tablet (10 mg total) by mouth daily.   fenofibrate  160 MG tablet TAKE 1 TABLET BY MOUTH EVERY DAY   fluticasone  (FLONASE ) 50 MCG/ACT nasal spray Place  1 spray into both nostrils daily.   mupirocin  ointment (BACTROBAN ) 2 % Apply 1 Application topically 2 (two) times daily.   Omega-3 Fatty Acids (FISH OIL) 1200 MG CAPS Take 1 capsule by mouth in the morning and at bedtime.   pantoprazole (PROTONIX) 20 MG tablet Take 1 tablet by mouth daily.   sildenafil (VIAGRA) 100 MG tablet Take 100 mg by mouth daily as needed for erectile dysfunction.   tamsulosin  (FLOMAX ) 0.4 MG CAPS capsule Take 1 capsule (0.4 mg total) by mouth daily after breakfast.   No facility-administered medications prior to visit.    Review of Systems  HENT: Negative.    Eyes: Negative.   Respiratory: Negative.    Cardiovascular: Negative.   Gastrointestinal: Negative.  Negative for constipation and diarrhea.  Genitourinary:        Nocturia improved on tamsulosin   Musculoskeletal:  Positive for joint pain (left shoulder).  Skin: Negative.   Neurological: Negative.   Endo/Heme/Allergies: Negative.        Objective:   BP 116/72   Pulse 75   Temp 98.5 F (36.9 C)   Ht 5' 10 (1.778 m)   Wt 224 lb 9.6 oz (101.9 kg)   SpO2 95%   BMI 32.23 kg/m   Vitals:   03/06/24 1101  BP: 116/72  Pulse: 75  Temp: 98.5 F (36.9 C)  Height: 5' 10 (1.778 m)  Weight: 224 lb 9.6 oz (101.9 kg)  SpO2: 95%  BMI (Calculated): 32.23    Physical Exam Vitals reviewed.  Constitutional:      Appearance: Normal appearance.  HENT:     Head: Normocephalic.     Left Ear: There is no impacted cerumen.     Nose: Nose normal.     Mouth/Throat:     Mouth: Mucous membranes are moist.     Pharynx: No posterior oropharyngeal erythema.  Eyes:     Extraocular Movements: Extraocular movements intact.     Pupils: Pupils are equal, round, and reactive to light.  Cardiovascular:     Rate and Rhythm: Regular rhythm.     Chest Wall: PMI is not displaced.     Pulses: Normal pulses.     Heart sounds: Normal heart sounds. No murmur heard. Pulmonary:     Effort: Pulmonary effort is normal.      Breath sounds: Normal air entry. No rhonchi or rales.  Abdominal:     General: Abdomen is flat. Bowel sounds are normal. There is no distension.     Palpations: Abdomen is soft. There is no hepatomegaly, splenomegaly  or mass.     Tenderness: There is no abdominal tenderness.  Musculoskeletal:        General: Normal range of motion.     Cervical back: Normal range of motion and neck supple.     Right lower leg: No edema.     Left lower leg: No edema.  Skin:    General: Skin is warm and dry.     Comments: Cellulitis of his right buttock  Neurological:     General: No focal deficit present.     Mental Status: He is alert and oriented to person, place, and time.     Cranial Nerves: No cranial nerve deficit.     Motor: No weakness.  Psychiatric:        Mood and Affect: Mood normal.        Behavior: Behavior normal.      No results found for any visits on 03/06/24.  Recent Results (from the past 2160 hours)  Hemoglobin A1c     Status: None   Collection Time: 01/23/24  9:08 AM  Result Value Ref Range   Hgb A1c MFr Bld 5.4 4.8 - 5.6 %    Comment:          Prediabetes: 5.7 - 6.4          Diabetes: >6.4          Glycemic control for adults with diabetes: <7.0    Est. average glucose Bld gHb Est-mCnc 108 mg/dL  TSH     Status: None   Collection Time: 01/23/24  9:08 AM  Result Value Ref Range   TSH 1.730 0.450 - 4.500 uIU/mL  CMP14+EGFR     Status: Abnormal   Collection Time: 01/23/24  9:08 AM  Result Value Ref Range   Glucose 92 70 - 99 mg/dL   BUN 18 8 - 27 mg/dL   Creatinine, Ser 8.63 (H) 0.76 - 1.27 mg/dL   eGFR 57 (L) >40 fO/fpw/8.26   BUN/Creatinine Ratio 13 10 - 24   Sodium 140 134 - 144 mmol/L   Potassium 4.6 3.5 - 5.2 mmol/L   Chloride 103 96 - 106 mmol/L   CO2 21 20 - 29 mmol/L   Calcium  9.8 8.6 - 10.2 mg/dL   Total Protein 6.3 6.0 - 8.5 g/dL   Albumin 4.1 3.9 - 4.9 g/dL   Globulin, Total 2.2 1.5 - 4.5 g/dL   Bilirubin Total 0.6 0.0 - 1.2 mg/dL   Alkaline  Phosphatase 65 44 - 121 IU/L   AST 17 0 - 40 IU/L   ALT 15 0 - 44 IU/L  Lipid panel     Status: None   Collection Time: 01/23/24  9:08 AM  Result Value Ref Range   Cholesterol, Total 168 100 - 199 mg/dL   Triglycerides 61 0 - 149 mg/dL   HDL 68 >60 mg/dL   VLDL Cholesterol Cal 12 5 - 40 mg/dL   LDL Chol Calc (NIH) 88 0 - 99 mg/dL   Chol/HDL Ratio 2.5 0.0 - 5.0 ratio    Comment:                                   T. Chol/HDL Ratio  Men  Women                               1/2 Avg.Risk  3.4    3.3                                   Avg.Risk  5.0    4.4                                2X Avg.Risk  9.6    7.1                                3X Avg.Risk 23.4   11.0   BMP8+Anion Gap     Status: None   Collection Time: 02/26/24  9:14 AM  Result Value Ref Range   Glucose 94 70 - 99 mg/dL   BUN 21 8 - 27 mg/dL   Creatinine, Ser 8.72 0.76 - 1.27 mg/dL   eGFR 62 >40 fO/fpw/8.26   BUN/Creatinine Ratio 17 10 - 24   Sodium 140 134 - 144 mmol/L   Potassium 4.3 3.5 - 5.2 mmol/L   Chloride 106 96 - 106 mmol/L   CO2 21 20 - 29 mmol/L   Anion Gap 13.0 10.0 - 18.0 mmol/L   Calcium  9.0 8.6 - 10.2 mg/dL  Comprehensive metabolic panel with GFR     Status: Abnormal   Collection Time: 02/26/24  9:14 AM  Result Value Ref Range   Total Protein 6.1 6.0 - 8.5 g/dL   Albumin 3.8 (L) 3.9 - 4.9 g/dL   Globulin, Total 2.3 1.5 - 4.5 g/dL   Bilirubin Total 0.6 0.0 - 1.2 mg/dL   Alkaline Phosphatase 60 44 - 121 IU/L   AST 19 0 - 40 IU/L   ALT 15 0 - 44 IU/L      Assessment & Plan:  As per problem list  Problem List Items Addressed This Visit       Other   Yellow jacket sting   Other Visit Diagnoses       AKI (acute kidney injury) (HCC)    -  Primary       Return in about 8 weeks (around 05/01/2024) for fu with labs prior.   Total time spent: 20 minutes  Sherrill Cinderella Perry, MD  03/06/2024   This document may have been prepared by [facility] Voice  Recognition software and as such may include unintentional dictation errors.

## 2024-04-02 ENCOUNTER — Other Ambulatory Visit: Payer: Self-pay | Admitting: Internal Medicine

## 2024-04-02 DIAGNOSIS — J301 Allergic rhinitis due to pollen: Secondary | ICD-10-CM

## 2024-04-14 ENCOUNTER — Other Ambulatory Visit: Payer: Self-pay | Admitting: Internal Medicine

## 2024-04-14 DIAGNOSIS — E782 Mixed hyperlipidemia: Secondary | ICD-10-CM

## 2024-04-24 ENCOUNTER — Other Ambulatory Visit: Payer: Self-pay | Admitting: Internal Medicine

## 2024-04-24 DIAGNOSIS — N401 Enlarged prostate with lower urinary tract symptoms: Secondary | ICD-10-CM

## 2024-04-26 ENCOUNTER — Other Ambulatory Visit: Payer: Self-pay | Admitting: Internal Medicine

## 2024-04-26 DIAGNOSIS — J301 Allergic rhinitis due to pollen: Secondary | ICD-10-CM

## 2024-04-28 ENCOUNTER — Other Ambulatory Visit: Payer: Self-pay | Admitting: Internal Medicine

## 2024-04-28 DIAGNOSIS — E782 Mixed hyperlipidemia: Secondary | ICD-10-CM

## 2024-05-03 NOTE — Progress Notes (Addendum)
 ASSESSMENT/PLAN:   James Hinton is a 68 y.o. 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 Repeat echocardiogram limited evaluation of pulmonary vasculature however BNP negative and with stable dyspnea on exertion. HRCT with stable fibrotic lung disease. Updating PFTs for this year. Having significant raynaud symptoms in the morning--adding amlodipine 2.5 mg and instructed to monitor blood pressures closely. He is 5-6 years out from his initial symptoms and we discussed natural history of limited systemic sclerosis with highest incidence of PAH occurring 10 years after diagnosis. Will plan on repeating echocardiogram if he is symptomatic with regular echos once he is at 10 year mark. PFTs done within past year without ILD. Reflux is controlled on daily PPI. Referring to Derm for skin check per patient request as well as spine center for cervical osteoarthritis.    Diagnosis ICD-10-CM Associated Orders  1. Neck and shoulder pain  M54.2 Ambulatory referral to Spine Center   M25.519     2. Limited systemic sclerosis    (CMS-HCC)  M34.9 Ambulatory referral to Dermatology    3. Raynaud's phenomenon without gangrene  I73.00 amlodipine (NORVASC) 2.5 MG tablet     Patient Instructions  - start amlodipine 2.5mg  (half a tablet) for Raynaud phenomenon  - I am referring you to the Spine Center and the  Dermatologist today  - please continue your other medications - the symptoms in your left hand sound like a nerve issue which could be related to your neck arthritis   Follow up in February    The patient was seen and discussed with my attending, Dr. Stacia Medora Harvest, MD, PGY-5 [facility] [address] [place], [place] [address] Phone: [phone] Fax: [phone]  ___________________________________________________________  RHEUMATOLOGY  CLINIC NOTE:  CHIEF COMPLAINT: Chronic condition follow up    HISTORY OF PRESENT ILLNESS: James Hinton is a 68 y.o. 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   He experiences worsening Raynaud phenomenon symptoms, particularly in the mornings and during cold weather. Nitroglycerin paste was prescribed for his digits, but there has been minimal improvement. He experiences throbbing and tingling in the fingers of his left hand, excluding the thumb, with symptoms not consistent daily. No ulcerations on the fingertips, but there is occasional skin breakdown around the cuticles.  He has a history of low blood pressure, leading to the discontinuation of amlodipine and benazepril. Currently, he is not on any blood pressure medications. Home blood pressure readings are around 118/76, with a higher reading of 130/77 during the visit. He recalls a previous experience of low blood pressure causing near syncope when on amlodipine.  In June, he had a significant reaction to yellow jacket stings, resulting in over 100 bites and subsequent kidney issues. He was treated with antibiotics for three weeks, and his kidney function has since returned to normal.  He experiences neck pain with radiation down one side and reports that previous x-rays were performed. He also reports itching on his head and arms, with concerns about potential skin cancer, though he has not seen a dermatologist in over 20 years.  He has a family history of atrial fibrillation in two sisters. No current reflux symptoms, but he continues to take pantoprazole daily. Occasionally, he experiences difficulty taking deep breaths but denies chest pain  or significant breathing issues. No issues with physical activity, aside from difficulty walking long distances, such as to a football game.      History of rheumatologic  illness - diagnosis: initially referred for positive RF in June 2023; at that time noted late onset Raynaud phenomenon, obtained ANA which was positive at 1:640 and ENA positive for centromere IgG, negative RNP, negative SCL70, negative RNA Poly III) - organ involvement: RP, GERD - PFTs: 04/2022 - FVC 101%, DLCO 76.7%, FVC/DLC 1.31, repeats scheduled 05/2023 - HRCT: 03/2022 - mild reticulations in mid and lower lungs - Echo: 04/2022 - normal RV function, unable to determine RVSP - treatments: conservative treatment for RP   REVIEW OF SYSTEMS : - See HPI The remainder of a complete ROS is otherwise negative.  Immunizations: Immunization History  Administered Date(s) Administered  . COVID-19 VACC,MRNA,(PFIZER)(PF) 11/02/2019, 11/30/2019, 08/13/2020  . INFLUENZA ADJUVANTED PF, IIV3(57YR UP)(FLUAD) 05/04/2024  . PCV21 (Capvaxive) (Pneumococcal 21-valent Conjugate Vaccine) 10/07/2023    PAST MEDICAL HISTORY: Past Medical History:  Diagnosis Date  . History of rectal bleeding    fecal blood check positive x2  . Hyperlipidemia   . Hypertension     PAST SURGICAL HISTORY: Past Surgical History:  Procedure Laterality Date  . APPENDECTOMY    . colonoscopy     x2  . PR COLSC FLX W/RMVL OF TUMOR POLYP LESION SNARE TQ N/A 07/06/2019   Procedure: COLONOSCOPY FLEX; W/REMOV TUMOR/LES BY SNARE;  Surgeon: Artist Jayson Leather, MD;  Location: [facility];  Service: Gastroenterology    Social History: ?? ? Social History   Tobacco Use  . Smoking status: Never  . Smokeless tobacco: Never  Vaping Use  . Vaping status: Never Used  Substance Use Topics  . Alcohol use: Not Currently    Alcohol/week: 1.0 standard drink of alcohol    Types: 1 Cans of beer per week  . Drug use: Not Currently  ? ? The patient lives with his wife in [place]   ??Occupation: retired  Tobacco: denies  Alcohol: occasional  Denies recreational drug use.  ??Family History: Reviewed history in  Epic with the patient and includes the following ?? Family History  Problem Relation Age of Onset  . Glaucoma Mother   . Heart disease Father   . Colon polyps Daughter   . Ovarian cancer Sister   . Cancer Paternal Grandmother 80       throat  ?   Immunization History  Administered Date(s) Administered  . COVID-19 VACC,MRNA,(PFIZER)(PF) 11/02/2019, 11/30/2019, 08/13/2020  . INFLUENZA ADJUVANTED PF, IIV3(57YR UP)(FLUAD) 05/04/2024  . PCV21 (Capvaxive) (Pneumococcal 21-valent Conjugate Vaccine) 10/07/2023    MEDICATIONS: Current Outpatient Medications  Medication Sig Dispense Refill  . aspirin 81 MG chewable tablet Chew 1 tablet (81 mg total) daily.    . cyanocobalamin, vitamin B-12, 1000 MCG tablet Take 1 tablet (1,000 mcg total) by mouth daily.    . diclofenac sodium (VOLTAREN) 1 % gel     . ezetimibe  (ZETIA ) 10 mg tablet Take 1 tablet (10 mg total) by mouth daily.    . fenofibrate  (LOFIBRA) 160 MG tablet Take 1 tablet (160 mg total) by mouth daily.    . nitroglycerin (NITROSTAT) 2 % ointment Place 0.5 inches on the skin Three (3) times a day. 45 g 11  . omega-3 fatty acids-fish oil 340-1,000 mg capsule Take 1 capsule by mouth.    . pantoprazole (PROTONIX) 20 MG tablet Take 1 tablet (20 mg total) by mouth daily. 30  tablet 11  . sildenafiL (VIAGRA) 100 MG tablet TAKE ONE TAB DAILY AS NEEDED 30 MIN TO 1HR BEFORE INTERCOURSE. QTY/DAY SUPPLY PER INSURANCE.    . tamsulosin  (FLOMAX ) 0.4 mg capsule Take 1 capsule (0.4 mg total) by mouth every morning.    SABRA amlodipine (NORVASC) 2.5 MG tablet Take 1 tablet (2.5 mg total) by mouth daily. 90 tablet 1  . benazepriL (LOTENSIN) 10 MG tablet Take 1 tablet (10 mg total) by mouth daily. (Patient not taking: Reported on 05/04/2024)    . fexofenadine  (ALLEGRA ) 180 MG tablet TAKE ONE TABLET BY MOUTH ONE TIME DAILY FOR ALLERGIES (Patient not taking: Reported on 05/04/2024)    . rosuvastatin (CRESTOR) 20 MG tablet Take 1 tablet (20 mg total) by mouth  daily. (Patient not taking: Reported on 05/04/2024)    . tiZANidine (ZANAFLEX) 2 MG tablet Take 1 tablet (2 mg total) by mouth every six (6) hours as needed. Once a night (Patient not taking: Reported on 05/04/2024)     No current facility-administered medications for this visit.    ALLERGIES: Allergies as of 05/04/2024 - Reviewed 05/04/2024  Allergen Reaction Noted  . Lipitor [atorvastatin] Hives and Itching 07/01/2019     PHYSICAL EXAM: BP 132/77 (BP Position: Sitting)   Pulse 64   Temp 36.9 C (98.4 F) (Temporal)   Wt (!) 105.7 kg (233 lb)   BMI 32.50 kg/m   General:  Well appearing, well nourished in no distress.  Normal affect.  Skin: no rash, no nodules, no tophi,  no digital pits or ulcers, minimal skin thickening distal to the MCPs; trace telangiectasis on bilateral cheeks Hair:  Normal texture and distribution  Head:  Normocephalic, atraumatic   Eyes:  Conjunctivae clear, sclerae non-icteric Mouth:  Mucous membranes moist Heart:  Regular rate, no murmur or gallop appreciated   Lungs:  Slight crackles in the lower lung bases  Abdomen:  no tenderness Back:  Spine normal without deformity Extremities:  no LE edema Joints: no synovitis of MCPs, PIPs, DIPs, wrists, elbows, shoulders, knees.  Neurologic: grossly intact. Negative Tinel sign.  Psychiatric:  Good judgment and insight, normal mood and affect   LABS RESULTED:  Inflammation and Autoimmunity Labs Lab Results  Component Value Date   ANA Positive (A) 03/23/2022   Lab Results  Component Value Date   RF 62.9 (H) 01/22/2022   CRP <4.0 01/22/2022   CBC and Chemistries and Endocrine Labs Lab Results  Component Value Date   WBC 5.6 01/22/2022   HGB 14.8 01/22/2022   HCT 44.3 01/22/2022   MCV 86.6 01/22/2022   PLT 171 01/22/2022   Lab Results  Component Value Date   BUN 17 01/22/2022   CREATININE 1.11 (H) 01/22/2022   EGFR 74 01/22/2022   NA 140 01/22/2022   K 4.2 01/22/2022   CL 107 01/22/2022    PROT 6.5 01/22/2022   ALBUMIN 3.7 01/22/2022   ALT 12 01/22/2022   AST 14 01/22/2022   ALKPHOS 69 01/22/2022   BILITOT 0.7 01/22/2022   CALCIUM  9.3 01/22/2022     DATA: PULMONARY FUNCTION TESTING RESULTS:   RADIOLOGY REVIEWED: TTE 05/2023 Summary   1. Limited study to assess PA pressures.   2. The left ventricle is normal in size with normal wall thickness.   3. The left ventricular systolic function is normal, LVEF is visually estimated at > 55%.   4. The right ventricle is normal in size, with normal systolic function.   5. IVC size and inspiratory change  suggest normal right atrial pressure. (0-5 mmHg).   6. The pulmonary systolic pressure cannot be estimated due to insufficient TR signal.   7. No significant change since prior study from 04/26/2022.   HRCT 05/2023 Stable mild fibrotic interstitial lung disease in a pattern indeterminate for UIP. This may also be NSIP. The findings are too mild to further characterize with certainty. However, there is definite interstitial lung disease present.    Small hiatal hernia

## 2024-05-04 ENCOUNTER — Other Ambulatory Visit

## 2024-05-05 ENCOUNTER — Ambulatory Visit: Payer: Self-pay | Admitting: Internal Medicine

## 2024-05-05 LAB — COMPREHENSIVE METABOLIC PANEL WITH GFR
ALT: 13 IU/L (ref 0–44)
AST: 17 IU/L (ref 0–40)
Albumin: 3.9 g/dL (ref 3.9–4.9)
Alkaline Phosphatase: 66 IU/L (ref 47–123)
BUN/Creatinine Ratio: 12 (ref 10–24)
BUN: 16 mg/dL (ref 8–27)
Bilirubin Total: 0.5 mg/dL (ref 0.0–1.2)
CO2: 23 mmol/L (ref 20–29)
Calcium: 9.2 mg/dL (ref 8.6–10.2)
Chloride: 105 mmol/L (ref 96–106)
Creatinine, Ser: 1.36 mg/dL — ABNORMAL HIGH (ref 0.76–1.27)
Globulin, Total: 2.2 g/dL (ref 1.5–4.5)
Glucose: 91 mg/dL (ref 70–99)
Potassium: 4.4 mmol/L (ref 3.5–5.2)
Sodium: 141 mmol/L (ref 134–144)
Total Protein: 6.1 g/dL (ref 6.0–8.5)
eGFR: 57 mL/min/1.73 — ABNORMAL LOW (ref >59)

## 2024-05-05 LAB — LIPID PANEL
Chol/HDL Ratio: 2.8 ratio (ref 0.0–5.0)
Cholesterol, Total: 170 mg/dL (ref 100–199)
HDL: 61 mg/dL (ref >39)
LDL Chol Calc (NIH): 93 mg/dL (ref 0–99)
Triglycerides: 86 mg/dL (ref 0–149)
VLDL Cholesterol Cal: 16 mg/dL (ref 5–40)

## 2024-05-05 NOTE — Progress Notes (Signed)
 I saw and evaluated the patient, participating in the key portions of the service.  I reviewed the resident???s note.  I agree with the resident???s findings and plan. Ezekiel Slocumb, MD

## 2024-05-08 ENCOUNTER — Ambulatory Visit (INDEPENDENT_AMBULATORY_CARE_PROVIDER_SITE_OTHER): Admitting: Internal Medicine

## 2024-05-08 VITALS — BP 116/72 | HR 73 | Temp 97.7°F | Ht 70.0 in | Wt 233.0 lb

## 2024-05-08 DIAGNOSIS — M349 Systemic sclerosis, unspecified: Secondary | ICD-10-CM

## 2024-05-08 DIAGNOSIS — N1831 Chronic kidney disease, stage 3a: Secondary | ICD-10-CM | POA: Insufficient documentation

## 2024-05-08 DIAGNOSIS — M47812 Spondylosis without myelopathy or radiculopathy, cervical region: Secondary | ICD-10-CM | POA: Insufficient documentation

## 2024-05-08 DIAGNOSIS — I10 Essential (primary) hypertension: Secondary | ICD-10-CM | POA: Diagnosis not present

## 2024-05-08 DIAGNOSIS — E782 Mixed hyperlipidemia: Secondary | ICD-10-CM

## 2024-05-08 DIAGNOSIS — R351 Nocturia: Secondary | ICD-10-CM

## 2024-05-08 DIAGNOSIS — N401 Enlarged prostate with lower urinary tract symptoms: Secondary | ICD-10-CM

## 2024-05-08 NOTE — Progress Notes (Signed)
 Established Patient Office Visit  Subjective:  Patient ID: James Hinton, male    DOB: 01-04-1956  Age: 68 y.o. MRN: [id number]  Chief Complaint  Patient presents with   Follow-up    8 week lab results    C/o itching of his scalp and right forearm and awaits dermatology appointment. LDL and TC well controlled on lab review. Triglycerides also satisfactory. Recently diagnosed with cervical spondylosis.    No other concerns at this time.   Past Medical History:  Diagnosis Date   BPH (benign prostatic hyperplasia)    Hypercholesterolemia    Hypertension    Intervertebral disc disorder    lumbar region   Kidney stones    Male erectile disorder     No past surgical history on file.  Social History   Socioeconomic History   Marital status: Divorced    Spouse name: Not on file   Number of children: Not on file   Years of education: Not on file   Highest education level: Not on file  Occupational History   Not on file  Tobacco Use   Smoking status: Never   Smokeless tobacco: Never  Vaping Use   Vaping status: Never Used  Substance and Sexual Activity   Alcohol use: Yes    Comment: rare drinking    Drug use: Never   Sexual activity: Yes  Other Topics Concern   Not on file  Social History Narrative   Not on file   Social Drivers of Health   Financial Resource Strain: Low Risk  (07/01/2019)   Received from [facility]   Overall Financial Resource Strain (CARDIA)    Difficulty of Paying Living Expenses: Not hard at all  Food Insecurity: No Food Insecurity (07/01/2019)   Received from [facility]   Hunger Vital Sign    Within the past 12 months, you worried that your food would run out before you got the money to buy more.: Never true    Within the past 12 months, the food you bought just didn't last and you didn't have money to get more.: Never true  Transportation Needs: No Transportation Needs (07/01/2019)   Received from [facility]    PRAPARE - Transportation    Lack of Transportation (Medical): No    Lack of Transportation (Non-Medical): No  Physical Activity: Not on file  Stress: Not on file  Social Connections: Unknown (07/01/2019)   Received from [facility]   Social Connection and Isolation Panel    Frequency of Communication with Friends and Family: Not on file    Frequency of Social Gatherings with Friends and Family: Not on file    Attends Religious Services: Not on file    Active Member of Clubs or Organizations: Not on file    Attends Banker Meetings: Not on file    Are you married, widowed, divorced, separated, never married, or living with a partner?: Married  Intimate Partner Violence: Not At Risk (07/01/2019)   Received from [facility]   Humiliation, Afraid, Rape, and Kick questionnaire    Within the last year, have you been afraid of your partner or ex-partner?: No    Within the last year, have you been humiliated or emotionally abused in other ways by your partner or ex-partner?: No    Within the last year, have you been kicked, hit, slapped, or otherwise physically hurt by your partner or ex-partner?: No    Within the last year,  have you been raped or forced to have any kind of sexual activity by your partner or ex-partner?: No    Family History  Problem Relation Age of Onset   Ovarian cancer Sister    Cancer Brother    Heart attack Father     Allergies  Allergen Reactions   Atorvastatin Rash, Hives and Itching   Rosuvastatin Other (See Comments)    Outpatient Medications Prior to Visit  Medication Sig   amLODipine (NORVASC) 2.5 MG tablet Take 2.5 mg by mouth daily.   aspirin EC 81 MG tablet Take 81 mg by mouth daily. Swallow whole.   b complex vitamins capsule Take 1 capsule by mouth daily.   cetirizine  (ZYRTEC ) 10 MG tablet TAKE 1 TABLET BY MOUTH EVERY DAY   Cholecalciferol (VITAMIN D3) 50 MCG (2000 UT) capsule Take 2,000 Units by mouth daily.   ezetimibe  (ZETIA )  10 MG tablet TAKE 1 TABLET BY MOUTH EVERY DAY   fenofibrate  160 MG tablet TAKE 1 TABLET BY MOUTH EVERY DAY   Magnesium Glycinate (MAGNESIUM GLYCINATE ADVANCED) 120 MG CAPS Take 240 mg by mouth daily.   Omega-3 Fatty Acids (FISH OIL) 1200 MG CAPS Take 1 capsule by mouth in the morning and at bedtime.   pantoprazole (PROTONIX) 20 MG tablet Take 1 tablet by mouth daily.   sildenafil (VIAGRA) 100 MG tablet Take 100 mg by mouth daily as needed for erectile dysfunction.   tamsulosin  (FLOMAX ) 0.4 MG CAPS capsule TAKE 1 CAPSULE (0.4 MG TOTAL) BY MOUTH DAILY AFTER BREAKFAST.   fluticasone  (FLONASE ) 50 MCG/ACT nasal spray Place 1 spray into both nostrils daily. (Patient not taking: Reported on 05/08/2024)   mupirocin  ointment (BACTROBAN ) 2 % Apply 1 Application topically 2 (two) times daily. (Patient not taking: Reported on 05/08/2024)   No facility-administered medications prior to visit.    Review of Systems  Constitutional:  Negative for weight loss (gained 11 lbs).  HENT: Negative.    Eyes: Negative.   Respiratory: Negative.    Cardiovascular: Negative.   Gastrointestinal: Negative.  Negative for constipation and diarrhea.  Genitourinary:        Nocturia improved on tamsulosin   Musculoskeletal:  Positive for joint pain (left shoulder) and neck pain.  Skin:  Positive for itching.  Neurological: Negative.   Endo/Heme/Allergies: Negative.        Objective:   BP 116/72   Pulse 73   Temp 97.7 F (36.5 C)   Ht 5' 10 (1.778 m)   Wt 233 lb (105.7 kg)   SpO2 97%   BMI 33.43 kg/m   Vitals:   05/08/24 0945  BP: 116/72  Pulse: 73  Temp: 97.7 F (36.5 C)  Height: 5' 10 (1.778 m)  Weight: 233 lb (105.7 kg)  SpO2: 97%  BMI (Calculated): 33.43    Physical Exam Vitals reviewed.  Constitutional:      Appearance: Normal appearance.  HENT:     Head: Normocephalic.     Left Ear: There is no impacted cerumen.     Nose: Nose normal.     Mouth/Throat:     Mouth: Mucous membranes are  moist.     Pharynx: No posterior oropharyngeal erythema.  Eyes:     Extraocular Movements: Extraocular movements intact.     Pupils: Pupils are equal, round, and reactive to light.  Cardiovascular:     Rate and Rhythm: Regular rhythm.     Chest Wall: PMI is not displaced.     Pulses: Normal pulses.  Heart sounds: Normal heart sounds. No murmur heard. Pulmonary:     Effort: Pulmonary effort is normal.     Breath sounds: Normal air entry. Examination of the right-lower field reveals rales. Examination of the left-lower field reveals rales. Rales present. No rhonchi.  Abdominal:     General: Abdomen is flat. Bowel sounds are normal. There is no distension.     Palpations: Abdomen is soft. There is no hepatomegaly, splenomegaly or mass.     Tenderness: There is no abdominal tenderness.  Musculoskeletal:        General: Normal range of motion.     Cervical back: Normal range of motion and neck supple.     Right lower leg: No edema.     Left lower leg: No edema.  Skin:    General: Skin is warm and dry.     Comments: Cellulitis of his right buttock  Neurological:     General: No focal deficit present.     Mental Status: He is alert and oriented to person, place, and time.     Cranial Nerves: No cranial nerve deficit.     Motor: No weakness.  Psychiatric:        Mood and Affect: Mood normal.        Behavior: Behavior normal.      No results found for any visits on 05/08/24.  Recent Results (from the past 2160 hours)  BMP8+Anion Gap     Status: None   Collection Time: 02/26/24  9:14 AM  Result Value Ref Range   Glucose 94 70 - 99 mg/dL   BUN 21 8 - 27 mg/dL   Creatinine, Ser 8.72 0.76 - 1.27 mg/dL   eGFR 62 >40 fO/fpw/8.26   BUN/Creatinine Ratio 17 10 - 24   Sodium 140 134 - 144 mmol/L   Potassium 4.3 3.5 - 5.2 mmol/L   Chloride 106 96 - 106 mmol/L   CO2 21 20 - 29 mmol/L   Anion Gap 13.0 10.0 - 18.0 mmol/L   Calcium  9.0 8.6 - 10.2 mg/dL  Comprehensive metabolic panel  with GFR     Status: Abnormal   Collection Time: 02/26/24  9:14 AM  Result Value Ref Range   Total Protein 6.1 6.0 - 8.5 g/dL   Albumin 3.8 (L) 3.9 - 4.9 g/dL   Globulin, Total 2.3 1.5 - 4.5 g/dL   Bilirubin Total 0.6 0.0 - 1.2 mg/dL   Alkaline Phosphatase 60 44 - 121 IU/L   AST 19 0 - 40 IU/L   ALT 15 0 - 44 IU/L  Lipid panel     Status: None   Collection Time: 05/04/24 10:27 AM  Result Value Ref Range   Cholesterol, Total 170 100 - 199 mg/dL   Triglycerides 86 0 - 149 mg/dL   HDL 61 >60 mg/dL   VLDL Cholesterol Cal 16 5 - 40 mg/dL   LDL Chol Calc (NIH) 93 0 - 99 mg/dL   Chol/HDL Ratio 2.8 0.0 - 5.0 ratio    Comment:                                   T. Chol/HDL Ratio                                             Men  Women                               1/2 Avg.Risk  3.4    3.3                                   Avg.Risk  5.0    4.4                                2X Avg.Risk  9.6    7.1                                3X Avg.Risk 23.4   11.0   Comprehensive metabolic panel with GFR     Status: Abnormal   Collection Time: 05/04/24 10:28 AM  Result Value Ref Range   Glucose 91 70 - 99 mg/dL   BUN 16 8 - 27 mg/dL   Creatinine, Ser 8.63 (H) 0.76 - 1.27 mg/dL   eGFR 57 (L) >40 fO/fpw/8.26   BUN/Creatinine Ratio 12 10 - 24   Sodium 141 134 - 144 mmol/L   Potassium 4.4 3.5 - 5.2 mmol/L   Chloride 105 96 - 106 mmol/L   CO2 23 20 - 29 mmol/L   Calcium  9.2 8.6 - 10.2 mg/dL   Total Protein 6.1 6.0 - 8.5 g/dL   Albumin 3.9 3.9 - 4.9 g/dL   Globulin, Total 2.2 1.5 - 4.5 g/dL   Bilirubin Total 0.5 0.0 - 1.2 mg/dL   Alkaline Phosphatase 66 47 - 123 IU/L   AST 17 0 - 40 IU/L   ALT 13 0 - 44 IU/L      Assessment & Plan:  James Hinton was seen today for follow-up.  Essential hypertension Overview:  Note: Stable   Cervical spondylosis  [facility]  CKD stage 3a, GFR 45-59 ml/min (HCC)  Avoid nephrotoxic agents  Mixed hyperlipidemia Continue statins   Scleroderma  (HCC)  Limited with some pulm fibrosis     Problem List Items Addressed This Visit       Cardiovascular and Mediastinum   Essential hypertension - Primary   Relevant Medications   amLODipine (NORVASC) 2.5 MG tablet   Other Relevant Orders   Comprehensive metabolic panel with GFR   CBC With Diff/Platelet     Musculoskeletal and Integument   Cervical spondylosis     Genitourinary   CKD stage 3a, GFR 45-59 ml/min (HCC)     Other   Scleroderma (HCC)   Hyperlipidemia   Relevant Medications   amLODipine (NORVASC) 2.5 MG tablet   Other Visit Diagnoses       BPH associated with nocturia       Relevant Orders   PSA       Return in about 3 months (around 08/08/2024) for awv with labs prior.   Total time spent: 20 minutes  Sherrill Cinderella Perry, MD  05/08/2024   This document may have been prepared by [facility] Voice Recognition software and as such may include unintentional dictation errors.

## 2024-07-10 ENCOUNTER — Other Ambulatory Visit: Payer: Self-pay | Admitting: Internal Medicine

## 2024-07-10 DIAGNOSIS — E782 Mixed hyperlipidemia: Secondary | ICD-10-CM

## 2024-07-16 NOTE — Therapy (Unsigned)
 OUTPATIENT PHYSICAL THERAPY EVALUATION   Patient Name: James Hinton MRN: [id number] DOB:26-Jan-1956, 68 y.o., male Today's Date: 07/23/2024  END OF SESSION:  PT End of Session - 07/23/24 1800     Visit Number 1    Number of Visits 17    Date for Recertification  10/15/24    Authorization Type MEDICARE PART B reporting period from 07/23/2024    Progress Note Due on Visit 10    PT Start Time 1647    PT Stop Time 1736    PT Time Calculation (min) 49 min    Activity Tolerance Patient tolerated treatment well    Behavior During Therapy [facility] for tasks assessed/performed          Past Medical History:  Diagnosis Date   BPH (benign prostatic hyperplasia)    Hypercholesterolemia    Hypertension    Intervertebral disc disorder    lumbar region   Kidney stones    Male erectile disorder    History reviewed. No pertinent surgical history. Patient Active Problem List   Diagnosis Date Noted   CKD stage 3a, GFR 45-59 ml/min (HCC) 05/08/2024   Cervical spondylosis 05/08/2024   Yellow jacket sting 02/06/2024   Cellulitis of buttock 01/31/2024   Thrombocytopenia 11/23/2022   Impaired fasting blood sugar 11/16/2022   Overweight 11/16/2022   Scleroderma (HCC) 11/16/2022   Other fatigue 11/16/2022   Allergic rhinitis due to pollen 09/12/2018   Low back pain 02/11/2015   Allergic urticaria 02/11/2015   Sciatica 02/11/2015   Hyperlipidemia 05/15/2011   Essential hypertension 10/01/2006    PCP: Albina GORMAN Dine, MD   REFERRING PROVIDER: Franky Ambrosia, DO [facility])  REFERRING DIAG: neck pain, chronic left shoulder pain   THERAPY DIAG:  Cervicalgia  Chronic left shoulder pain  Pain in thoracic spine  Rationale for Evaluation and Treatment: Rehabilitation  ONSET DATE: 2 years prior to initial PT evaluation  SUBJECTIVE:                                                                                                                                                                                                          SUBJECTIVE STATEMENT: Patient is here for physical therapy evaluation and treatment for neck and L shoulder pain with specific request to use MDT/[facility] approach with Cert. MDT physical therapist.   Hand dominance: Right  PERTINENT HISTORY:  Patient is a 68 y.o. male who presents to outpatient physical therapy with a referral for medical diagnosis neck pain, chronic left shoulder pain. This patient's chief complaints consist of  left sided neck pain and left shoulder pain after falling on this part of his body off of a piece of equipment 2 years ago, leading to the following functional deficits: ***. Relevant past medical history and comorbidities include the following: he has Impaired fasting blood sugar; Overweight; Scleroderma (HCC); Stable mild fibrotic interstitial lung disease;Other fatigue; Essential hypertension; Hyperlipidemia; Allergic rhinitis due to pollen; Low back pain; Allergic urticaria; Sciatica; Thrombocytopenia; Cellulitis of buttock; CKD stage 3a, GFR 45-59 ml/min (HCC); and Cervical spondylosis; BPH (benign prostatic hyperplasia), Hypercholesterolemia, Intervertebral disc disorder, Kidney stones, and Male erectile disorder. he  has no past surgical history on file. Patient denies hx of cancer, stroke, seizures, heart problems, diabetes, unexplained weight loss, unexplained changes in bowel or bladder problems, unexplained stumbling or dropping things, osteoporosis, and spinal surgery, or shoulder surgery.   PAIN: Are you having pain? Yes NPRS: Current: 2-3/10,  Best: 0/10, Worst: 5-6/10. Pain location: pain neck from mastoid process to top of shoulder girdle and a quartar sized distinct spot at the front of his L shoulder.  Pain description: pulling, undescribed, denies paresthesias Aggravating factors: see below Relieving factors: see below   PRECAUTIONS: None  WEIGHT BEARING RESTRICTIONS: No  FALLS:  Has patient  fallen in last 6 months? 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  Outcome / Screening Score:  NECK DISABILITY INDEX  Date: 07/23/24 Score  Pain intensity 1 = The pain is very mild at the moment  2. Personal care (washing, dressing, etc.) 0 = I can look after myself normally without causing extra pain  3. Lifting 1 =  I can lift heavy weights but it gives extra pain  4. Reading 1 = I can read as much as I want to with slight pain in my neck  5. Headaches 1 =  I have slight headaches, which come infrequently  6. Concentration 0 =  I can concentrate fully when I want to with no difficulty  7. Work 0 =  I can do as much work as I want to  8. Driving 1 =  I can drive my car as long as I want with slight pain in my neck  9. Sleeping 2 = My sleep is mildly disturbed (1-2 hrs sleepless)  10. Recreation 1 =  I am able to engage in all my recreation activities, with some pain in my neck  Total 8/50 (16%)  Minimum Detectable Change (90% confidence): 5 points or 10% points  Upper Extremity Functional Scale: Extreme difficulty/unable (0), Quite a bit of difficulty (1), Moderate difficulty (2), Little difficulty (3), No difficulty (4) Survey date:   07/23/24  Any of your usual work, household or school activities 3  2. Your usual hobbies, recreational/sport activities 3   3. Lifting a bag of groceries to waist level 3   4. Lifting a bag of  groceries above your head 2  5. Grooming your hair 3  6. Pushing up on your hands (I.e. from bathtub  or chair) 3  7. Preparing food (I.e. peeling/cutting) 3  8. Driving  3  9. Vacuuming, sweeping, or raking 3  10. Dressing  4  11. Doing up buttons 4  12. Using tools/appliances 4  13. Opening doors 4  14. Cleaning  4  15. Tying or lacing shoes   16. Sleeping  2  17. Laundering clothes (I.e. washing, ironing, folding) 4  18. Opening a jar 4  19. Throwing a ball 3  20. Carrying a small suitcase with your affected limb.  3  Score total:  62/76 (82%)  07/23/2024: Score percentage calculated from 70 instead of 80 since pt missed answering one question.  NPRS (0-10): Current: 2-3/10,  Best: 0/10, Worst: 5-6/10. Present Symptoms: pain neck from mastoid process to top of shoulder girdle and a quartar sized distinct spot at the front of his L shoulder. He has pain at night when he turns (not at rest).,  Present Since: did not get much better over the first year, but he doesn't have as much pain as when it happened. Commenced As a Result of: Patient states he fell off of a piece of equipment onto his left shoulder and it stretched his neck into R sidebending about 2 years ago. He was able to get back up and continue with pain. He went to his doctor maybe 6 months later because it didn't seem like it was getting better. He was referred to PT before but he never came. He had no trouble with his neck or shoulder before that.   Symptoms at Onset: neck from mastoid process to top of shoulder girdle and a quartar sized distinct spot at the front of his L shoulder.  Constant Symptoms: neck and shoulder Intermittent Symptoms:   Worse - Bending: Sometimes - Sitting: Never - Turning: Always - Lying: Never after initial positioning - Rising: Sometimes - AM: Never - As the Day Progresses: Sometimes - PM: ? - When Still: Never - On the Move: Sometimes - Other: lifting weight with the L UE, using L  UE  Better - Bending: Never - Sitting: Sometimes - Turning: Never - Lying: Always as long as he doesn't move - Rising: Never - AM: Sometimes - As the Day Progresses: Never - PM: Never - When Still: Always - On the Move: Never - Other: heat, manual assessment of his neck, ice hot, hand massager  Sleep and Rest Disturbed Sleep: Yes: wakes him up multiple times a night when he moves Sleeping Postures: starts on back, and then turns to R side. Side to like to sleep on L but now cannot.  Pillows: 1 firm pillow under neck, 1 fluffly pillow for arm  Medical History Previous Spinal History: 3 degenerative levels in neck, no neck surgeries. Has DDD in low back (no surgeries).  Previous Treatments: muscle relaxer for a while (might have helped, but helped back more).   SPECIFIC QUESTIONS - Dizziness: No, Tinnitus: no, Nausea: No, Vision: No, Speech: No. - Upper Limbs: Abnormal- using L arm creates pain - Gait: Normal  Medications: see chart General Health/Comorbidities:  Impaired fasting blood sugar; Overweight; Scleroderma (HCC); Stable mild fibrotic interstitial lung disease;Other fatigue; Essential hypertension; Hyperlipidemia; Allergic rhinitis due to pollen; Low back pain; Allergic urticaria; Sciatica; Thrombocytopenia; Cellulitis of buttock; CKD stage 3a, GFR 45-59 ml/min (HCC); and Cervical spondylosis; BPH (benign prostatic hyperplasia), Hypercholesterolemia, Intervertebral disc disorder, Kidney stones, and Male erectile disorder. Patient denies hx of cancer, stroke, seizures, heart problems, diabetes, unexplained weight loss, unexplained  changes in bowel or bladder problems, unexplained stumbling or dropping things, osteoporosis, and spinal surgery, or shoulder surgery.  Recent/Relevant Surgery: No History of Cancer: No Unexplained Weight Loss: No History of Trauma: No Imaging: Yes:   Cervical spine xray report from 10/07/2023: EXAM: XR CERVICAL SPINE AP AND LATERAL  DATE:  10/07/2023 9:29 AM  ACCESSION: [id number] UN  DICTATED: 10/07/2023 11:18 AM  INTERPRETATION LOCATION: [facility]   CLINICAL INDICATION: 68 years old Male with neck pain, shoulder pain, suspecting degenerative arthritis  - M34.9 - Limited scleroderma (CMS - HCC) - M54.2 - Neck and shoulder pain - M25.519 - Neck and shoulder pain    COMPARISON: None.   TECHNIQUE: AP and lateral views of the cervical spine.   FINDINGS:  Degenerative disc disease includes intervertebral narrowing, anterior ossicles and osteophytosis, particularly at C4-5, C5-6 and C6-7. There is no prevertebral soft tissue swelling, listhesis or evidence for recent fracture. The AP view shows uncovertebral overgrowth at multiple levels bilaterally, but oblique views were not requested or obtained, and patency of the foramina cannot be assessed. There are radiodensities overlying the soft tissues of the neck on the right side which suggest external garment artifacts. No evidence for inflammatory spondyloarthropathy or soft tissue swelling.   IMPRESSION:  Multilevel degenerative disc disease.   Bilateral shoulder Xray report from 11/12/2022:  EXAM: XR SHOULDER 3 OR MORE VIEWS BILATERAL  DATE: 11/12/2022 4:35 PM  ACCESSION: [id number] UN  DICTATED: 11/12/2022 4:41 PM  INTERPRETATION LOCATION: [facility]   CLINICAL INDICATION: 68 years old Male with right shoulder  - S49.92XD - Injury of left shoulder, subsequent encounter    COMPARISON: None.   TECHNIQUE: AP, Grashey, and axillary views of the shoulders.   FINDINGS:  No acute fracture. Joints are normal in alignment. Mild glenohumeral joint space narrowing. Osseous hypertrophy at bilateral acromioclavicular joints. Small ossific loose body at the superior aspect of left AC joint. Cortical irregularity at bilateral greater tuberosities indicative of rotator cuff tendinopathy. No focal soft tissue swelling. Visualized lungs are clear.   IMPRESSION:  No acute osseous abnormality.   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   Patient Goals/Expectations: less pain   OBJECTIVE:   EXAMINATION  Sitting Posture: neutral Protruded head: slighty Lateral deviation: slight left sidebend and right rotation Change of Posture Effect: no effect Lateral deviation relevant:unsure Other Observations/Functional Baselines: Left rotation  Neurological  Motor Deficit:  Shoulder ER at neutral: L weaker than R and produces concordant pain Abductor digiti minimi B equal Finger adductors  B equal Reflexes: deferred Sensory Deficit: L 5th digit diminished to light touch Neurodynamic Tests: deferred  Movement Loss Movement Loss Symptoms  Protrusion nil none  Flexion mod Pain when first moving into position  Retraction min None, straining  Extension mod Pain at L CT junction radiating to L  Lateral flexion R nil L tightness  Lateral flexion L min L sided pain, R sided tightness  Rotation R mod none  Rotation L  mod L sided pain    Repeated Movement Testing Pretest symptoms (in sitting): 2-3/10 Pre-Test Symptoms Test Movement Symptom During Symptom After Mechanical Response Key Functional Test  2-3/10 at left side of neck Repeated retraction in sitting. 1x10 no effect, feels popping Better: 2/10,  Decreased pain with L rotation   2/10 left side of neck Rep Retraction in Sitting 1x10 no effect better Minimal to no pain with L rotation Resisted L shoulder ER less painful  0/10 Rep Retraction in Sitting 1x3 produces  Rep Retraction in sitting with self OP decreases better increased ROM, less pain with left rotation.      Assessment  Provisional Classification: Derangement vs other Directional Preference: extension Other: Structurally Compromised (history of trauma at start with long history of symptoms since, symptoms match UT or possibly SCM trauma, pain with load to the L arm, but not radicular)  Plan PRINCIPLES OF  MANAGEMENT Education: seated posture, POC,  Exercise Type: ***  Frequency: ***  Other Exercises / Interventions: *** Management Goals: ***    TREATMENT                                                                                                                             PATIENT EDUCATION:  Education details: *** Person educated: {Person educated:25204} Education method: {Education Method:25205} Education comprehension: {Education Comprehension:25206}  HOME EXERCISE PROGRAM: ***  ASSESSMENT:  CLINICAL IMPRESSION: Patient is a 68 y.o. male referred to outpatient physical therapy with a medical diagnosis of neck pain, chronic left shoulder pain  who presents with signs and symptoms consistent with ***. Patient presents with significant *** impairments that are limiting ability to complete *** without difficulty. Patient will benefit from skilled physical therapy intervention to address current body structure impairments and activity limitations to improve function and work towards goals set in current POC in order to return to prior level of function or maximal functional improvement.   Mechanical sensitivities: ***   OBJECTIVE IMPAIRMENTS: {opptimpairments:25111}.   ACTIVITY LIMITATIONS: {activitylimitations:27494}  PARTICIPATION LIMITATIONS: {participationrestrictions:25113}  PERSONAL FACTORS: {Personal factors:25162} are also affecting patient's functional outcome.   REHAB POTENTIAL: {rehabpotential:25112}  CLINICAL DECISION MAKING: {clinical decision making:25114}  EVALUATION COMPLEXITY: {Evaluation complexity:25115}   GOALS: Goals reviewed with patient? {yes/no:20286}  SHORT TERM GOALS: Target date: 08/06/2024  Patient will be independent with initial home exercise program for self-management of symptoms. Baseline: {HEPbaseline4:27310} (07/23/2024); Goal status: INITIAL  LONG TERM GOALS: Target date: 10/15/2024  Patient will be independent with a long-term  home exercise program for self-management of symptoms.  Baseline: {HEPbaseline4:27310} (07/23/2024); Goal status: INITIAL  2.  Patient will demonstrate improved {SarasLTGPRO:32233} to demonstrate improvement in overall condition and self-reported functional ability.  Baseline: {Sarasgoalbaseline:32234} (07/23/2024); Goal status: INITIAL  3.  *** Baseline: {Sarasgoalbaseline:32234} (07/23/2024); Goal status: INITIAL  4.  *** Baseline: {Sarasgoalbaseline:32234} (07/23/2024); Goal status: INITIAL  5.  Patient will demonstrate improvement in Patient Specific Functional Scale (PSFS) of equal or greater than 8/10 points to reflect clinically significant improvement in patient's most valued functional activities. Baseline: {Sarasgoalbaseline:32234} (07/23/2024); Goal status: INITIAL  6.  Patient will report NPRS equal or less than 3/10 during functional activities during the last 2 weeks to improve their abilitly to complete community, work and/or recreational activities with less limitation. Baseline: ***/10 (07/23/2024); Goal status: INITIAL    PLAN:  PT FREQUENCY: {rehab frequency:25116}  PT DURATION: {rehab duration:25117}  PLANNED INTERVENTIONS: {rehab planned interventions:25118::97110-Therapeutic exercises,97530- Therapeutic [phone]- Neuromuscular re-education,97535- Self Rjmz,[id number]- Manual therapy,Patient/Family education}  PLAN FOR NEXT  SESSION: PIERRETTE Camie SAUNDERS. Juli, PT, DPT, Cert. MDT, PRA-C 07/23/2024, 6:15 PM  [facility] [address] [place], [place] [address] P: [phone] I F: [phone]

## 2024-07-23 ENCOUNTER — Ambulatory Visit: Attending: Physical Medicine & Rehabilitation | Admitting: Physical Therapy

## 2024-07-23 ENCOUNTER — Encounter: Payer: Self-pay | Admitting: Physical Therapy

## 2024-07-23 DIAGNOSIS — M542 Cervicalgia: Secondary | ICD-10-CM | POA: Diagnosis present

## 2024-07-23 DIAGNOSIS — M546 Pain in thoracic spine: Secondary | ICD-10-CM | POA: Insufficient documentation

## 2024-07-23 DIAGNOSIS — M25512 Pain in left shoulder: Secondary | ICD-10-CM | POA: Diagnosis present

## 2024-07-23 DIAGNOSIS — G8929 Other chronic pain: Secondary | ICD-10-CM | POA: Diagnosis present

## 2024-07-28 ENCOUNTER — Ambulatory Visit: Admitting: Physical Therapy

## 2024-08-04 ENCOUNTER — Ambulatory Visit: Admitting: Physical Therapy

## 2024-08-12 ENCOUNTER — Ambulatory Visit: Admitting: Physical Therapy

## 2024-08-13 ENCOUNTER — Other Ambulatory Visit

## 2024-08-14 ENCOUNTER — Ambulatory Visit: Admitting: Internal Medicine

## 2024-08-14 ENCOUNTER — Ambulatory Visit: Payer: Self-pay | Admitting: Internal Medicine

## 2024-08-14 ENCOUNTER — Encounter: Payer: Self-pay | Admitting: Internal Medicine

## 2024-08-14 VITALS — BP 120/68 | HR 70 | Temp 97.6°F | Ht 70.0 in | Wt 227.0 lb

## 2024-08-14 DIAGNOSIS — N521 Erectile dysfunction due to diseases classified elsewhere: Secondary | ICD-10-CM

## 2024-08-14 DIAGNOSIS — N1831 Chronic kidney disease, stage 3a: Secondary | ICD-10-CM

## 2024-08-14 DIAGNOSIS — Z0001 Encounter for general adult medical examination with abnormal findings: Secondary | ICD-10-CM

## 2024-08-14 DIAGNOSIS — E782 Mixed hyperlipidemia: Secondary | ICD-10-CM

## 2024-08-14 DIAGNOSIS — M349 Systemic sclerosis, unspecified: Secondary | ICD-10-CM

## 2024-08-14 DIAGNOSIS — I10 Essential (primary) hypertension: Secondary | ICD-10-CM | POA: Diagnosis not present

## 2024-08-14 DIAGNOSIS — Z1389 Encounter for screening for other disorder: Secondary | ICD-10-CM

## 2024-08-14 DIAGNOSIS — Z739 Problem related to life management difficulty, unspecified: Secondary | ICD-10-CM | POA: Diagnosis not present

## 2024-08-14 LAB — LIPID PANEL
Chol/HDL Ratio: 3.1 ratio (ref 0.0–5.0)
Cholesterol, Total: 153 mg/dL (ref 100–199)
HDL: 50 mg/dL
LDL Chol Calc (NIH): 88 mg/dL (ref 0–99)
Triglycerides: 78 mg/dL (ref 0–149)
VLDL Cholesterol Cal: 15 mg/dL (ref 5–40)

## 2024-08-14 LAB — CBC WITH DIFF/PLATELET
Basophils Absolute: 0 x10E3/uL (ref 0.0–0.2)
Basos: 1 %
EOS (ABSOLUTE): 0.3 x10E3/uL (ref 0.0–0.4)
Eos: 7 %
Hematocrit: 45 % (ref 37.5–51.0)
Hemoglobin: 14.5 g/dL (ref 13.0–17.7)
Immature Grans (Abs): 0 x10E3/uL (ref 0.0–0.1)
Immature Granulocytes: 0 %
Lymphocytes Absolute: 1.3 x10E3/uL (ref 0.7–3.1)
Lymphs: 28 %
MCH: 29.2 pg (ref 26.6–33.0)
MCHC: 32.2 g/dL (ref 31.5–35.7)
MCV: 91 fL (ref 79–97)
Monocytes Absolute: 0.3 x10E3/uL (ref 0.1–0.9)
Monocytes: 6 %
Neutrophils Absolute: 2.6 x10E3/uL (ref 1.4–7.0)
Neutrophils: 58 %
Platelets: 193 x10E3/uL (ref 150–450)
RBC: 4.97 x10E6/uL (ref 4.14–5.80)
RDW: 13.3 % (ref 11.6–15.4)
WBC: 4.5 x10E3/uL (ref 3.4–10.8)

## 2024-08-14 LAB — COMPREHENSIVE METABOLIC PANEL WITH GFR
ALT: 13 IU/L (ref 0–44)
AST: 16 IU/L (ref 0–40)
Albumin: 3.8 g/dL — ABNORMAL LOW (ref 3.9–4.9)
Alkaline Phosphatase: 66 IU/L (ref 47–123)
BUN/Creatinine Ratio: 15 (ref 10–24)
BUN: 19 mg/dL (ref 8–27)
Bilirubin Total: 0.6 mg/dL (ref 0.0–1.2)
CO2: 23 mmol/L (ref 20–29)
Calcium: 8.9 mg/dL (ref 8.6–10.2)
Chloride: 104 mmol/L (ref 96–106)
Creatinine, Ser: 1.24 mg/dL (ref 0.76–1.27)
Globulin, Total: 2.4 g/dL (ref 1.5–4.5)
Glucose: 89 mg/dL (ref 70–99)
Potassium: 4.1 mmol/L (ref 3.5–5.2)
Sodium: 141 mmol/L (ref 134–144)
Total Protein: 6.2 g/dL (ref 6.0–8.5)
eGFR: 64 mL/min/1.73

## 2024-08-14 LAB — PSA: Prostate Specific Ag, Serum: 2.3 ng/mL (ref 0.0–4.0)

## 2024-08-14 MED ORDER — SILDENAFIL CITRATE 100 MG PO TABS: 100.0000 mg | ORAL_TABLET | Freq: Every day | ORAL | 2 refills | Status: AC | PRN

## 2024-08-14 NOTE — Progress Notes (Signed)
 "  Established Patient Office Visit  Subjective:  Patient ID: James Hinton, male    DOB: [birth year]  Age: 69 y.o. MRN: [id number]  Chief Complaint  Patient presents with   Annual Exam    AWV & Lab Results    No new complaints, here for AWV refer to quality metrics and scanned documents. LDL and TC well controlled on lab review. Triglycerides also satisfactory with unremarkable cbc and psa. CMP notable for normalization of gfr and Cr.      No other concerns at this time.   Past Medical History:  Diagnosis Date   BPH (benign prostatic hyperplasia)    Hypercholesterolemia    Hypertension    Intervertebral disc disorder    lumbar region   Kidney stones    Male erectile disorder     No past surgical history on file.  Social History   Socioeconomic History   Marital status: Divorced    Spouse name: Not on file   Number of children: Not on file   Years of education: Not on file   Highest education level: Not on file  Occupational History   Not on file  Tobacco Use   Smoking status: Never   Smokeless tobacco: Never  Vaping Use   Vaping status: Never Used  Substance and Sexual Activity   Alcohol use: Yes    Comment: rare drinking    Drug use: Never   Sexual activity: Yes  Other Topics Concern   Not on file  Social History Narrative   Not on file   Social Drivers of Health   Tobacco Use: Low Risk (07/23/2024)   Patient History    Smoking Tobacco Use: Never    Smokeless Tobacco Use: Never    Passive Exposure: Not on file  Financial Resource Strain: Not on file  Food Insecurity: Not on file  Transportation Needs: Not on file  Physical Activity: Not on file  Stress: Not on file  Social Connections: Not on file  Intimate Partner Violence: Not on file  Depression (PHQ2-9): Low Risk (08/14/2024)   Depression (PHQ2-9)    PHQ-2 Score: 3  Alcohol Screen: Not on file  Housing: Not on file  Utilities: Not on file  Health Literacy: Not on file    Family  History  Problem Relation Age of Onset   Ovarian cancer Sister    Cancer Brother    Heart attack Father     Allergies[1]  Show/hide medication list[2]  Review of Systems  Constitutional:  Positive for weight loss (6 lbs).  HENT: Negative.    Eyes: Negative.   Respiratory:  Positive for cough. Negative for shortness of breath and wheezing.   Cardiovascular: Negative.   Gastrointestinal: Negative.  Negative for constipation and diarrhea.  Genitourinary:        Nocturia improved on tamsulosin   Musculoskeletal:  Positive for joint pain (left shoulder) and neck pain.  Skin:  Positive for itching.  Neurological: Negative.   Endo/Heme/Allergies: Negative.        Objective:   BP 120/68   Pulse 70   Temp 97.6 F (36.4 C) (Tympanic)   Ht 5' 10 (1.778 m)   Wt 227 lb (103 kg)   SpO2 100%   BMI 32.57 kg/m   Vitals:   08/14/24 0947  BP: 120/68  Pulse: 70  Temp: 97.6 F (36.4 C)  Height: 5' 10 (1.778 m)  Weight: 227 lb (103 kg)  SpO2: 100%  TempSrc: Tympanic  BMI (Calculated): 32.57  Physical Exam Vitals reviewed.  Constitutional:      Appearance: Normal appearance.  HENT:     Head: Normocephalic.     Left Ear: There is no impacted cerumen.     Nose: Nose normal.     Mouth/Throat:     Mouth: Mucous membranes are moist.     Pharynx: No posterior oropharyngeal erythema.  Eyes:     Extraocular Movements: Extraocular movements intact.     Pupils: Pupils are equal, round, and reactive to light.  Cardiovascular:     Rate and Rhythm: Regular rhythm.     Chest Wall: PMI is not displaced.     Pulses: Normal pulses.     Heart sounds: Normal heart sounds. No murmur heard. Pulmonary:     Effort: Pulmonary effort is normal.     Breath sounds: Normal air entry. Examination of the right-lower field reveals rales. Examination of the left-lower field reveals rales. Rales present. No rhonchi.  Abdominal:     General: Abdomen is flat. Bowel sounds are normal. There is no  distension.     Palpations: Abdomen is soft. There is no hepatomegaly, splenomegaly or mass.     Tenderness: There is no abdominal tenderness.  Musculoskeletal:        General: Normal range of motion.     Cervical back: Normal range of motion and neck supple.     Right lower leg: No edema.     Left lower leg: No edema.  Skin:    General: Skin is warm and dry.     Comments: Cellulitis of his right buttock  Neurological:     General: No focal deficit present.     Mental Status: He is alert and oriented to person, place, and time.     Cranial Nerves: No cranial nerve deficit.     Motor: No weakness.  Psychiatric:        Mood and Affect: Mood normal.        Behavior: Behavior normal.      No results found for any visits on 08/14/24.  Recent Results (from the past 2160 hours)  Lipid panel     Status: None   Collection Time: 08/13/24  8:14 AM  Result Value Ref Range   Cholesterol, Total 153 100 - 199 mg/dL   Triglycerides 78 0 - 149 mg/dL   HDL 50 >60 mg/dL   VLDL Cholesterol Cal 15 5 - 40 mg/dL   LDL Chol Calc (NIH) 88 0 - 99 mg/dL   Chol/HDL Ratio 3.1 0.0 - 5.0 ratio    Comment:                                   T. Chol/HDL Ratio                                             Men  Women                               1/2 Avg.Risk  3.4    3.3                                   Avg.Risk  5.0    4.4                                2X Avg.Risk  9.6    7.1                                3X Avg.Risk 23.4   11.0   Comprehensive metabolic panel with GFR     Status: Abnormal   Collection Time: 08/13/24  8:15 AM  Result Value Ref Range   Glucose 89 70 - 99 mg/dL   BUN 19 8 - 27 mg/dL   Creatinine, Ser 8.75 0.76 - 1.27 mg/dL   eGFR 64 >40 fO/fpw/8.26   BUN/Creatinine Ratio 15 10 - 24   Sodium 141 134 - 144 mmol/L   Potassium 4.1 3.5 - 5.2 mmol/L   Chloride 104 96 - 106 mmol/L   CO2 23 20 - 29 mmol/L   Calcium  8.9 8.6 - 10.2 mg/dL   Total Protein 6.2 6.0 - 8.5 g/dL   Albumin 3.8 (L)  3.9 - 4.9 g/dL   Globulin, Total 2.4 1.5 - 4.5 g/dL   Bilirubin Total 0.6 0.0 - 1.2 mg/dL   Alkaline Phosphatase 66 47 - 123 IU/L   AST 16 0 - 40 IU/L   ALT 13 0 - 44 IU/L  CBC With Diff/Platelet     Status: None   Collection Time: 08/13/24  8:15 AM  Result Value Ref Range   WBC 4.5 3.4 - 10.8 x10E3/uL   RBC 4.97 4.14 - 5.80 x10E6/uL   Hemoglobin 14.5 13.0 - 17.7 g/dL   Hematocrit 54.9 62.4 - 51.0 %   MCV 91 79 - 97 fL   MCH 29.2 26.6 - 33.0 pg   MCHC 32.2 31.5 - 35.7 g/dL   RDW 86.6 88.3 - 84.5 %   Platelets 193 150 - 450 x10E3/uL   Neutrophils 58 Not Estab. %   Lymphs 28 Not Estab. %   Monocytes 6 Not Estab. %   Eos 7 Not Estab. %   Basos 1 Not Estab. %   Neutrophils Absolute 2.6 1.4 - 7.0 x10E3/uL   Lymphocytes Absolute 1.3 0.7 - 3.1 x10E3/uL   Monocytes Absolute 0.3 0.1 - 0.9 x10E3/uL   EOS (ABSOLUTE) 0.3 0.0 - 0.4 x10E3/uL   Basophils Absolute 0.0 0.0 - 0.2 x10E3/uL   Immature Granulocytes 0 Not Estab. %   Immature Grans (Abs) 0.0 0.0 - 0.1 x10E3/uL  PSA     Status: None   Collection Time: 08/13/24  8:15 AM  Result Value Ref Range   Prostate Specific Ag, Serum 2.3 0.0 - 4.0 ng/mL    Comment: Roche ECLIA methodology. According to the American U[employer] Association, Serum PSA should decrease and remain at undetectable levels after radical prostatectomy. The AUA defines biochemical recurrence as an initial PSA value 0.2 ng/mL or greater followed by a subsequent confirmatory PSA value 0.2 ng/mL or greater. Values obtained with different assay methods or kits cannot be used interchangeably. Results cannot be interpreted as absolute evidence of the presence or absence of malignant disease.       Assessment & Plan:  Emmerson was seen today for annual exam.  Mixed hyperlipidemia Overview: Formatting of this note might be different from the original. Note: Unchanged  Orders: -     Lipid panel  CKD stage 3a, GFR 45-59 ml/min (HCC)  Essential  hypertension Overview: Formatting of this note might be different from the original. 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   Total time spent: 30 minutes. This time includes review of previous notes and results and patient face to face interaction during today'Keyshawna Prouse visit.    Sherrill Cinderella Perry, MD  08/14/2024   This document may have been prepared by [facility] Voice Recognition software and as such may include unintentional dictation errors.     [1]  Allergies Allergen Reactions   Atorvastatin Rash, Hives and Itching   Rosuvastatin Other (See Comments)  [2]  Outpatient Medications Prior to Visit  Medication Sig Note   amLODipine (NORVASC) 2.5 MG tablet Take 2.5 mg by mouth daily.    aspirin EC 81 MG tablet Take 81 mg by mouth daily. Swallow whole.    b complex vitamins capsule Take 1 capsule by mouth daily.    Cholecalciferol (VITAMIN D3) 50 MCG (2000 UT) capsule Take 2,000 Units by mouth daily.    ezetimibe  (ZETIA ) 10 MG tablet TAKE 1 TABLET BY MOUTH EVERY DAY    Magnesium Glycinate (MAGNESIUM GLYCINATE ADVANCED) 120 MG CAPS Take 240 mg by mouth daily.    Omega-3 Fatty Acids (FISH OIL) 1200 MG CAPS Take 1 capsule  by mouth in the morning and at bedtime.    pantoprazole (PROTONIX) 20 MG tablet Take 1 tablet by mouth daily.    tamsulosin  (FLOMAX ) 0.4 MG CAPS capsule TAKE 1 CAPSULE (0.4 MG TOTAL) BY MOUTH DAILY AFTER BREAKFAST.    [DISCONTINUED] cetirizine  (ZYRTEC ) 10 MG tablet TAKE 1 TABLET BY MOUTH EVERY DAY 08/14/2024: no effective   [DISCONTINUED] sildenafil  (VIAGRA ) 100 MG tablet Take 100 mg by mouth daily as needed for erectile dysfunction.    fenofibrate  160 MG tablet TAKE 1 TABLET BY MOUTH EVERY DAY    fluticasone  (FLONASE ) 50 MCG/ACT nasal spray Place 1 spray into both nostrils daily. (Patient not taking: Reported on 05/08/2024)    mupirocin  ointment (BACTROBAN ) 2 % Apply 1 Application topically 2 (two) times daily. (Patient not taking: Reported on 05/08/2024)    No facility-administered medications prior to visit.   "

## 2024-08-17 ENCOUNTER — Ambulatory Visit: Admitting: Physical Therapy

## 2024-08-19 ENCOUNTER — Encounter: Payer: Self-pay | Admitting: Physical Therapy

## 2024-08-19 ENCOUNTER — Ambulatory Visit: Admitting: Physical Therapy

## 2024-08-19 ENCOUNTER — Ambulatory Visit: Attending: Physical Medicine & Rehabilitation | Admitting: Physical Therapy

## 2024-08-19 DIAGNOSIS — M25512 Pain in left shoulder: Secondary | ICD-10-CM | POA: Insufficient documentation

## 2024-08-19 DIAGNOSIS — M546 Pain in thoracic spine: Secondary | ICD-10-CM | POA: Diagnosis present

## 2024-08-19 DIAGNOSIS — G8929 Other chronic pain: Secondary | ICD-10-CM | POA: Diagnosis present

## 2024-08-19 DIAGNOSIS — M542 Cervicalgia: Secondary | ICD-10-CM | POA: Diagnosis present

## 2024-08-19 NOTE — Therapy (Signed)
 " OUTPATIENT PHYSICAL THERAPY TREATMENT   Patient Name: James Hinton MRN: [id number] DOB:09/06/1955, 69 y.o., male Today's Date: 08/19/2024  END OF SESSION:  PT End of Session - 08/19/24 1738     Visit Number 2    Number of Visits 17    Date for Recertification  10/15/24    Authorization Type MEDICARE PART B reporting period from 07/23/2024    Progress Note Due on Visit 10    PT Start Time 1737    PT Stop Time 1819    PT Time Calculation (min) 42 min    Activity Tolerance Patient tolerated treatment well    Behavior During Therapy [facility] for tasks assessed/performed           Past Medical History:  Diagnosis Date   BPH (benign prostatic hyperplasia)    Hypercholesterolemia    Hypertension    Intervertebral disc disorder    lumbar region   Kidney stones    Male erectile disorder    History reviewed. No pertinent surgical history. Patient Active Problem List   Diagnosis Date Noted   CKD stage 3a, GFR 45-59 ml/min (HCC) 05/08/2024   Cervical spondylosis 05/08/2024   Yellow jacket sting 02/06/2024   Cellulitis of buttock 01/31/2024   Thrombocytopenia 11/23/2022   Impaired fasting blood sugar 11/16/2022   Overweight 11/16/2022   Scleroderma (HCC) 11/16/2022   Other fatigue 11/16/2022   Allergic rhinitis due to pollen 09/12/2018   Low back pain 02/11/2015   Allergic urticaria 02/11/2015   Sciatica 02/11/2015   Hyperlipidemia 05/15/2011   Essential hypertension 10/01/2006    PCP: Albina GORMAN Dine, MD   REFERRING PROVIDER: Franky Ambrosia, DO [facility])  REFERRING DIAG: neck pain, chronic left shoulder pain   THERAPY DIAG:  Cervicalgia  Chronic left shoulder pain  Pain in thoracic spine  Rationale for Evaluation and Treatment: Rehabilitation  ONSET DATE: 2 years prior to initial PT evaluation  SUBJECTIVE:                                                                                                                                                                                                          PERTINENT HISTORY:  Patient is a 69 y.o. male who presents to outpatient physical therapy with a referral for medical diagnosis neck pain, chronic left shoulder pain. This patient's chief complaints consist of left sided neck pain and left shoulder pain after falling on this part of his body off of a piece of equipment 2 years ago, leading to the following functional deficits: difficulty turning  head (L > R), sleeping, using L UE (pushing, pulling, stabilizing, reaching, lifting), working (holding dog on leash), yardwork, raking, carrying a 5 gal bucket of sand (80#) and covering truffles at work (had to go to 2.5 gal bucket), etc.  . Relevant past medical history and comorbidities include the following: he has Impaired fasting blood sugar; Overweight; Scleroderma (HCC); Stable mild fibrotic interstitial lung disease;Other fatigue; Essential hypertension; Hyperlipidemia; Allergic rhinitis due to pollen; Low back pain; Allergic urticaria; Sciatica; Thrombocytopenia; Cellulitis of buttock; CKD stage 3a, GFR 45-59 ml/min (HCC); and Cervical spondylosis; BPH (benign prostatic hyperplasia), Hypercholesterolemia, Intervertebral disc disorder, Kidney stones, and Male erectile disorder. he  has no past surgical history on file. Patient denies hx of cancer, stroke, seizures, heart problems, diabetes, unexplained weight loss, unexplained changes in bowel or bladder problems, unexplained stumbling or dropping things, osteoporosis, and spinal surgery, or shoulder surgery.   SUBJECTIVE STATEMENT Patient states his pain has mostly switched sides since his initial evaluation about 1 month ago. He has pain over the right side of his neck. He states it seems like his pain is getting better with pain less bad but switched sides. He was sick last week. The pain switched yesterday when he was picking up a 50# bucket of sand. He has not been doing repeated extension but  not as much as he is supposed to.  PAIN: NPRS: 6-7/10 right side of his neck, 3-4/10 left side of his neck.   From initial PT evaluation on 07/23/24: NPRS: Current: 2-3/10,  Best: 0/10, Worst: 5-6/10. Pain location: pain neck from mastoid process to top of shoulder girdle and a quartar sized distinct spot at the front of his L shoulder.  Pain description: pulling, undescribed, denies paresthesias Aggravating factors: see below Relieving factors: see below   PRECAUTIONS: None  WEIGHT BEARING RESTRICTIONS: No  FALLS:  Has patient fallen in last 6 months? No  PATIENT GOALS: less pain  OBJECTIVE:   SELF-REPORTED FUNCTION THE PATIENT SPECIFIC FUNCTIONAL SCALE Place score of 0-10 (0 = unable to perform activity and 10 = able to perform activity at the same level as before injury or problem) Activity Date:          2.     3.     4.      Total Score     Total Score = Sum of activity scores/number of activities Minimally Detectable Change: 3 points (for single activity); 2 points (for average score) [facility] (nd). The Patient Specific Functional Scale . Retrieved from [email or web address]   Neurological  Motor Deficit:  Shoulder ER at neutral: L neck concordant pain, and over anterior L shoulder but equal in strength Abductor digiti minimi B equal Finger adductors  B equal Reflexes: deferred Sensory Deficit: C4-8 equal to light touch Neurodynamic Tests: deferred  Movement Loss Movement Loss Symptoms  Protrusion nil none  Flexion min Pain when first moving into position  Retraction min None, straining  Extension min Pain at L CT junction radiating to L  Lateral flexion R min Stretch sensation over L UT  Lateral flexion L min none  Rotation R nil none  Rotation L  min L sided pain    Repeated Movement Testing Pre-Test Symptoms Test Movement Symptom During Symptom After Mechanical Response Key  Functional Test  6-7/10 right side of his neck, 3-4/10 left side of his neck.  Repeated retraction with self overpressure in sitting. 1x10 no effect,  No effect Extension not painful    6-7/10  right side of his neck, 3-4/10 left side of his neck.  Rep Retraction to extension in sitting 1x10 Increased pain at left UT No effect Slightly increased L rotation same pain   6-7/10 right side of his neck, 3-4/10 left side of his neck.  Rep thoracic extension with hands behind head and leg crossed over, 1x10 No effect Worse over left upper trap Slightly increased L rotation same pain   6-7/10 right side of his neck, 3-4/10 left side of his neck.  Rep Retraction to extension in sitting 1x5 then with rotation Increases with mouth open and rotation better, less painful when he turns his neck increased ROM, less pain with left rotation.  Now painful (over L shoulder) with L shoulder resisted ER   Repeated retraction with clinician OP in seated, 2x6 decreasing Better (pain down to 3/10) Increased L rotation with less pain Decreased pain with L shoulder ER   Prone  repeated cervical spine retraction with self OP 1x10  Pain centralized but still 6-7/10 Slightly worse than before      TREATMENT                                                                                                                          Therapeutic exercise: therapeutic exercises that incorporate ONE parameter at one or more areas of the body to centralize symptoms, develop strength and endurance, range of motion, and flexibility required for successful completion of functional activities.  Cervical spine AROM and sensory/myotome testing to check progress and response (see above)  Repeated motions (see above).   Education on HEP including handout    Manual therapy: to reduce pain and tissue tension, improve range of motion, neuromodulation, in order to promote improved ability to complete functional activities.  Seated cervical spine  retraction with clinician OP (see repeated motions chart under objective)  Pt required multimodal cuing for proper technique and to facilitate improved neuromuscular control, strength, range of motion, and functional ability resulting in improved performance and form.   PATIENT EDUCATION:  Education details: Exercise purpose/form. Self management techniques. Education on diagnosis, prognosis, POC, anatomy and physiology of current condition. Education on HEP including handout. Person educated: Patient Education method: Explanation, Demonstration, and Handouts Education comprehension: verbalized understanding, returned demonstration, and needs further education  HOME EXERCISE PROGRAM: Access Code: [email or web address] URL: [email or web address]  HOME EXERCISE PROGRAM [[email or web address]]  Seated Cervical Retraction-extension-rotation -  Repeat 10 Repetitions, Complete 1 Set, Perform 6 Times a Day  Static retraction in prone - Dustin -  Repeat 10 Repetitions, Complete 1 Set, Perform 6 Times a Day  ASSESSMENT:  CLINICAL IMPRESSION: Patient returns to PT nearly 4 weeks since initial PT evaulation. He did not complete cervical retraction exercise at recommended frequency, which is expected since it is meant to be checked for efficacy after 1-2 days of faithful practice (but he was unable to return until now due to scheduling restrictions and illness). He demonstrates mildly improved pain and motion but is  continuing to have neck pain and limitations. Force progressed today with excellent results from clinician overpressure that did not last with patient generated forces. Attempted thoracic extension since it appeared he may be responding to pressure at the upper thoracic spine, but he had increased pain with attempts to perform exercises for this. Plan to attempt more exercises with patient-generated forces for the thoracic spine next session if no response to progression from today. Patient would benefit  from continued management of limiting condition by skilled physical therapist to address remaining impairments and functional limitations to work towards stated goals and return to PLOF or maximal functional independence.    From initial PT evaluation from 07/23/24: Patient is a 69 y.o. male referred to outpatient physical therapy with a medical diagnosis of neck pain, chronic left shoulder pain  who presents with signs and symptoms consistent with chronic left neck pain that affects function of left shoulder. He is showing signs of responding to repeated retraction with improved cervical spine L rotation active range motion with less pain following retraction exercises. He also seemed to have activation pain of the left cervical spine muscles without strong evidence of neuro symptoms below the shoulder, but will be more thoroughly tested in the future. His concordant symptoms come on closer to end range L rotation, suggesting lower cervical or upper thoracic spine as region of pain, as does decreased sensation at L 5th finger. He also gets concordant pain at CT junction region with cervical spine extension. Difficulty using his left UE seems to be more specific to loading the upper traps or shoulder girdle muscles that also attach to the spine and head such as the SCM or UT. Plan to further investigate this if repeated motions does not clear symptoms. His pattern of symptoms including (worst pain with movement and with activation of neck to shoulder girdle muscles) paired with the traumatic onset falling onto his left neck and shoulder, describe as overstretching into R cervical sidebending while shoulder was depressed, suggest possible soft tissue trauma that never healed right or that has developed a tendinopathy. Plan to investigate and address further (and refer back for further medical assessment if appropriate) if initial repeated motions exam and approach does not fully resolve symptoms. Patient  expressed concern that this is the case, but 2 years is longer than normal healing time for this type of injury, so it should be stable for conservative care at this point and could easily have multi-factorial contributing causes (such as a derangement syndrome) that could be address in PT. He could also have local tissue source of pain from the shoulder, but it seems best to address the neck first and move focus to the shoulder if it is not resolved by attention at the neck, since neck pain generators can masquerade as shoulder pain and cause false positives for shoulder tests. His primary complaint does not mimic usual presentation of shoulder pain. 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 Patient does have some scheduling challenges. He is only available on Thursdays and Fridays and therapist is not available on Fridays. The next two weeks the clinic is closed for Christmas and New Years Day on Thursday, and the following week is already full, so it will likely be 3-4 weeks before he is seen again. Ideally, he would be seen in 2 days time. However in January, appointments will be available for him weekly. But his scheduling difficulties will slow his progress,  at least at first. Patient presents with significant pain, ROM, joint stiffness, muscle tension, muscle performance (strength/power/endurance), knowledge, and activity tolerance impairments that are limiting ability to complete usual activities such as turning head (L > R), sleeping, using L UE (pushing, pulling, stabilizing, reaching, lifting), working (holding dog on leash), yardwork, raking, carrying a 5 gal bucket of sand (80#) and covering truffles at work (had to go to 2.5 gal bucket), etc without difficulty. Patient will benefit from skilled physical therapy intervention to address current body structure impairments and activity limitations to improve function and work  towards goals set in current POC in order to return to prior level of function or maximal functional improvement.   Mechanical sensitivities: cervical L rotation, extension (single bouts)  MDT Assessment Provisional Classification: Derangement vs other Directional Preference: extension Other: Structurally Compromised (history of trauma at start with long history of symptoms since, symptoms match UT or possibly SCM trauma, pain with load to the L arm, but not radicular)  OBJECTIVE IMPAIRMENTS: decreased activity tolerance, decreased endurance, decreased knowledge of condition, decreased ROM, decreased strength, hypomobility, increased muscle spasms, impaired flexibility, impaired sensation, impaired UE functional use, postural dysfunction, and pain.   ACTIVITY LIMITATIONS: carrying, lifting, sleeping, bed mobility, and reach over head  PARTICIPATION LIMITATIONS: cleaning, laundry, driving, shopping, community activity, occupation, and yard work  PERSONAL FACTORS: Age, Past/current experiences, Time since onset of injury/illness/exacerbation, and 3+ comorbidities:  Impaired fasting blood sugar; Overweight; Scleroderma (HCC); Stable mild fibrotic interstitial lung disease;Other fatigue; Essential hypertension; Hyperlipidemia; Allergic rhinitis due to pollen; Low back pain; Allergic urticaria; Sciatica; Thrombocytopenia; Cellulitis of buttock; CKD stage 3a, GFR 45-59 ml/min (HCC); and Cervical spondylosis; BPH (benign prostatic hyperplasia), Hypercholesterolemia, Intervertebral disc disorder, Kidney stones, and Male erectile disorder are also affecting patient's functional outcome.   REHAB POTENTIAL: Good  CLINICAL DECISION MAKING: Evolving/moderate complexity  EVALUATION COMPLEXITY: Moderate   GOALS: Goals reviewed with patient? No  SHORT TERM GOALS: Target date: 08/06/2024  Patient will be independent with initial home exercise program for self-management of symptoms. Baseline: Initial HEP  provided at IE (07/23/2024); Goal status: In progress  LONG TERM GOALS: Target date: 10/15/2024  Patient will be independent with a long-term home exercise program for self-management of symptoms.  Baseline: Initial HEP provided at IE (07/23/2024); Goal status: In progress   2.  Patient will demonstrate improved Upper Extremity Functional Scale (UEFS) to equal or greater than 93% to demonstrate improvement in overall condition and self-reported functional ability.  Baseline: 82% (07/23/2024); Goal status: In progress  3.  Patient will demonstrate L UE MMT/Myotomes of equal or greater than 4/5 without increased symptoms to improve his ability to complete work and household responsibilities with less pain  Baseline: pain with use of L UE including ER limited by concordant neck pain (07/23/2024); Goal status: In progress  4.  Patient will demonstrate full expected cervical spine AROM wtihout increased symptoms beyond intermittent end range discomfort to improve his ability to view surroundings and sleep.  Baseline: limited and painful - see objective (07/23/2024); Goal status: In progress  5.  Patient will demonstrate improvement in Patient Specific Functional Scale (PSFS) of equal or greater than 8/10 points to reflect clinically significant improvement in patient's most valued functional activities. Baseline: to be measured at visit 2 as appropriate (07/23/2024); Goal status: In progress note  6.  Patient will report NPRS equal or less than 1/10 during functional activities during the last 2 weeks to improve their abilitly to complete community, work and/or recreational  activities with less limitation. Baseline: 6/10 (07/23/2024); Goal status: In progress   PLAN:  PT FREQUENCY: 2x/week  PT DURATION: 12 weeks  PLANNED INTERVENTIONS: 02835- PT Re-evaluation, 97750- Physical Performance Testing, 97110-Therapeutic exercises, 97530- Therapeutic activity, [id number]- Neuromuscular  re-education, 97535- Self Care, 02859- Manual therapy, [id number]- Electrical stimulation (unattended), 20560 (1-2 muscles), 20561 (3+ muscles)- Dry Needling, Patient/Family education, Cryotherapy, and Moist heat  PLAN FOR NEXT SESSION: Vitals, PSFS, Plan to attempt more exercises with patient-generated forces for the thoracic spine next session if no response to progression from today.,  continue with repeated motions to confirm or exclude MDT classification. Consider further exam of DTR, myotomes, soft tissue, symptom regionalization, cervical traction alleviation testing and progress to matched interventions.   MDT Plan PRINCIPLES OF MANAGEMENT Education: seated posture, POC,  Exercise Type: repeated cervical retraction with extension or prone retraction with self overpressure  Frequency: every 2-4 hours while awake Other Exercises / Interventions: sitting with lumbar towel roll Management Goals: identify classification, then treat appropriately   Camie SAUNDERS. Juli, PT, DPT, Cert. MDT, PRA-C 08/19/2024, 6:50 PM  [facility] [address] [place], [place] [address] P: [phone] I F: [phone]     "

## 2024-08-26 ENCOUNTER — Ambulatory Visit: Admitting: Physical Therapy

## 2024-08-26 ENCOUNTER — Encounter: Payer: Self-pay | Admitting: Physical Therapy

## 2024-08-26 DIAGNOSIS — M546 Pain in thoracic spine: Secondary | ICD-10-CM

## 2024-08-26 DIAGNOSIS — M542 Cervicalgia: Secondary | ICD-10-CM

## 2024-08-26 DIAGNOSIS — G8929 Other chronic pain: Secondary | ICD-10-CM

## 2024-08-26 NOTE — Therapy (Unsigned)
 " OUTPATIENT PHYSICAL THERAPY TREATMENT   Patient Name: James Hinton MRN: [id number] DOB:[birth year], 69 y.o., male Today's Date: 08/26/2024  END OF SESSION:  PT End of Session - 08/26/24 1646     Visit Number 3    Number of Visits 17    Date for Recertification  10/15/24    Authorization Type MEDICARE PART B reporting period from 07/23/2024    Progress Note Due on Visit 10    PT Start Time 1646    PT Stop Time 1750    PT Time Calculation (min) 64 min    Activity Tolerance Patient tolerated treatment well    Behavior During Therapy [facility] for tasks assessed/performed            Past Medical History:  Diagnosis Date   BPH (benign prostatic hyperplasia)    Hypercholesterolemia    Hypertension    Intervertebral disc disorder    lumbar region   Kidney stones    Male erectile disorder    History reviewed. No pertinent surgical history. Patient Active Problem List   Diagnosis Date Noted   CKD stage 3a, GFR 45-59 ml/min (HCC) 05/08/2024   Cervical spondylosis 05/08/2024   Yellow jacket sting 02/06/2024   Cellulitis of buttock 01/31/2024   Thrombocytopenia 11/23/2022   Impaired fasting blood sugar 11/16/2022   Overweight 11/16/2022   Scleroderma (HCC) 11/16/2022   Other fatigue 11/16/2022   Allergic rhinitis due to pollen 09/12/2018   Low back pain 02/11/2015   Allergic urticaria 02/11/2015   Sciatica 02/11/2015   Hyperlipidemia 05/15/2011   Essential hypertension 10/01/2006    PCP: Albina GORMAN Dine, MD   REFERRING PROVIDER: Franky Ambrosia, DO [facility])  REFERRING DIAG: neck pain, chronic left shoulder pain   THERAPY DIAG:  Cervicalgia  Chronic left shoulder pain  Pain in thoracic spine  Rationale for Evaluation and Treatment: Rehabilitation  ONSET DATE: 2 years prior to initial PT evaluation  SUBJECTIVE:                                                                                                                                                                                                          PERTINENT HISTORY:  Patient is a 69 y.o. male who presents to outpatient physical therapy with a referral for medical diagnosis neck pain, chronic left shoulder pain. This patient's chief complaints consist of left sided neck pain and left shoulder pain after falling on this part of his body off of a piece of equipment 2 years ago, leading to the following functional deficits: difficulty  turning head (L > R), sleeping, using L UE (pushing, pulling, stabilizing, reaching, lifting), working (holding dog on leash), yardwork, raking, carrying a 5 gal bucket of sand (80#) and covering truffles at work (had to go to 2.5 gal bucket), etc.  . Relevant past medical history and comorbidities include the following: he has Impaired fasting blood sugar; Overweight; Scleroderma (HCC); Stable mild fibrotic interstitial lung disease;Other fatigue; Essential hypertension; Hyperlipidemia; Allergic rhinitis due to pollen; Low back pain; Allergic urticaria; Sciatica; Thrombocytopenia; Cellulitis of buttock; CKD stage 3a, GFR 45-59 ml/min (HCC); and Cervical spondylosis; BPH (benign prostatic hyperplasia), Hypercholesterolemia, Intervertebral disc disorder, Kidney stones, and Male erectile disorder. he  has no past surgical history on file. Patient denies hx of cancer, stroke, seizures, heart problems, diabetes, unexplained weight loss, unexplained changes in bowel or bladder problems, unexplained stumbling or dropping things, osteoporosis, and spinal surgery, or shoulder surgery.   Exercise history: He used to lift weights and his favorite was doing flies. It has been 5-6 years since he has done that.   SUBJECTIVE STATEMENT Patient states he has been working hard this week and been busy. He harvested 80# of truffles this week. It is the first week of harvest (lasts until the end of march). He did good on the exercise until Monday morning. Since then he did not do 6 x  a day, maybe two because he was so busy working. He states he gets some tightness in the middle of his lower neck and right sided pian over his right jaw and neck. He uses a scientist, clinical (histocompatibility and immunogenetics) that hits it and it causes a little pain and feel better afterwards. It makes it so he can sleep. The spot on his left shoulder has not bothered him. At first 6 times a day with HEP he felt stiffer afterwards but later it feels like he felt best when doing the exercises more often.  He will be going to [place]  to work for a few days at the end of next week, weather permitting.   Hardest motion for L shoulder is weight over head.   He used to lift weights and his favorite was doing flies. It has been 5-6 years since he has done that.   PAIN: NPRS: 2-3/10 at the right side of his neck from his mastoid process down the posterior neck.   From initial PT evaluation on 07/23/24: NPRS: Current: 2-3/10,  Best: 0/10, Worst: 5-6/10. Pain location: pain neck from mastoid process to top of shoulder girdle and a quartar sized distinct spot at the front of his L shoulder.  Pain description: pulling, undescribed, denies paresthesias Aggravating factors: see below Relieving factors: see below   PRECAUTIONS: None  WEIGHT BEARING RESTRICTIONS: No  FALLS:  Has patient fallen in last 6 months? No  PATIENT GOALS: less pain  OBJECTIVE:   SELF-REPORTED FUNCTION THE PATIENT SPECIFIC FUNCTIONAL SCALE Place score of 0-10 (0 = unable to perform activity and 10 = able to perform activity at the same level as before injury or problem) Activity 08/26/24    sleeping  7.5/10    2.   Lifting with either arm  5/10    3.   Turing head to see cars and driving  2/89    4.      Total Score     Total Score = Sum of activity scores/number of activities Minimally Detectable Change: 3 points (for single activity); 2 points (for average score) [facility] (nd). The Patient Specific Functional Scale .  Retrieved from  [email or web address]   Neurological  AROM of B shoulder grossly [facility]  Neurological Testing (with MMT) Reflex Testing Biceps tendon (C5/C6) R: 2+ L: 2+ Wrist extensors (C6) R: 2+ L: 2+ Triceps tendon (C7) R: 2+ L: 2+ Pronator quadratus (C7) R: 0+ L: 0+ Common wrist flexor tendons (C8) R: 1+ L: 1+ Myotome/MMT Testing Shoulder flexion R: 4/5 L: 4+/5 painful Shoulder Abduction (C5) R: 5/5 L: 4+/5 pain at distal biceps region Shoulder ER (C5) R: 5/5 L: variable: 5/5 at times, but pain and giving way at times, with  Biceps brachii (C5/C6) R: 4/5 pain in distal biceps region L: 4+/5 Triceps (C7) R: 4+/5 L: 5/5 pain in elbow  Movement Loss Movement Loss Symptoms  Flexion min   Retraction min   Extension min Pain at L CT junction  Lateral flexion R nil Stretch sensation over L UT  Lateral flexion L min L pain base of neck  Rotation R nil   Rotation L  min L sided pain    Repeated Movement Pre-Test Symptoms Test Movement Symptom During Symptom After Mechanical Response Key Functional Test  See above Rep Retraction to extension with rotation  in sitting 1x10  increases base of neck to left  No effect Slightly less pain with left side bending     Repeated retraction with clinician OP in seated, 6x6 Decreases pain Pain abolished No pain and full ROM with L rotation and slight discomfort with L SB  B shoulder ER: weaker on the left and popped and weak  0/10 pain Rep Retraction to extension with rotation  in sitting 1x10  No effect No effect No pain with left rotation or sidebending. Equal to R. Slightly weaker L ER than R                          TREATMENT                                                                                                                          Therapeutic exercise: therapeutic exercises that incorporate ONE parameter at one or more areas of the body to centralize symptoms, develop  strength and endurance, range of motion, and flexibility required for successful completion of functional activities.  Cervical spine AROM and myotome/MMT/Reflex testing to check progress and response as well as for further exam of L shoulder (see above)  Repeated motions (see above).   Education on HEP including handout    Manual therapy: to reduce pain and tissue tension, improve range of motion, neuromodulation, in order to promote improved ability to complete functional activities.  Seated cervical spine retraction with clinician OP (see repeated motions chart under objective)  Neuromuscular Re-education: a technique or exercise performed with the goal of improving the level of communication between the body and the brain, such as for balance, motor control, muscle activation patterns, coordination, desensitization, quality of muscle contraction, proprioception, and/or kinesthetic sense needed for  successful and safe completion of functional activities.   Standing banded B scapular retraction with hands holding band/loop at shoulder level, keeping elbows by sides to improve intrascapular recruitment, strength, and endurance while down-training upper trap recruitment. 2x20 with RTB loop (tried GTB loop first but too hard) Heavy cuing at first for form Still over activating UT some (L >R) and also performing cervical retraction and thoracic extension to attempt to perform the movement despite cuing.  Noted for L shoulder riding higher than R with increased UT muscle bulk on that side and lack of shoulder ER occurring on L compared to R in this position.  Added to HEP  Half kneeling lat pull down with focus on recruiting intrascapular muscles and lower traps allowing bar to pull arms up during eccentric phase to avoid UT activation.  2x20 with double silver TB (tried single first and too easy) Cuing for elbows to floor Added to HEP Pt provided band and door anchor for home use, and was  show how to set up at home  Pt required multimodal cuing for proper technique and to facilitate improved neuromuscular control, strength, range of motion, and functional ability resulting in improved performance and form.   PATIENT EDUCATION:  Education details: Exercise purpose/form. Self management techniques. Education on diagnosis, prognosis, POC, anatomy and physiology of current condition. Education on HEP including handout. Person educated: Patient Education method: Explanation, Demonstration, and Handouts Education comprehension: verbalized understanding, returned demonstration, and needs further education  HOME EXERCISE PROGRAM: Access Code: [email or web address] URL: [email or web address] Date: 08/26/2024 Prepared by: Camie Cleverly  Exercises - B Shoulder ER with fists to shoulders for intrascapular strengthening   - 1 x daily - 2-3 sets - 20 reps - 1-5 second hold - Lat Pull with band/cable  - 1 x daily - 2-3 sets - 20 reps - 5 seconds hold  HOME EXERCISE PROGRAM [[email or web address]]  Seated Cervical Retraction-extension-rotation -  Repeat 10 Repetitions, Complete 1 Set, Perform 6 Times a Day  Static retraction in prone - Dustin -  Repeat 10 Repetitions, Complete 1 Set, Perform 6 Times a Day  ASSESSMENT:  CLINICAL IMPRESSION: Patient returns with good tolerance to HEP and report of improved symtoms, but not much objective improvement between visits. He responded extremely well to manual intervention with cervical retraction with clinician overpressure today, which abolished symptoms. He was then able to complete progressed cervical retraction with self overpressure to extension with rotation without return of symptoms at the neck for the remainder of the session. This was updated in his HEP. He did continue to have some inconsistent pain, popping, and weakness in the L shoulder that seemed less likely to be myotomal based on other improvements and it's quick changes. Shoulder was further  examined and exercises to help strengthen shoulder girdle and improve motor control to keep from aggravating the neck and improve shoulder mechanics were completed and added to HEP. Pt noted to have higher riding L shoulder compared to R with increased UT muscle bulk on the left. He also had more trouble with shoulder ER positioning on the L with W exercise and demonstrated difficulty isolating shoulder/scapular motion from cervical spine retraction and thoracic extension. He would benefit from further work in this area. He had grossly equal shoulder AROM bilaterally but had some pain and slight weakness on the L compared to the right with flexion, abduction, and ER. He was actually weaker in elbow flexion on the L compared to the right. DTRs of in B UE were equal.  Pt may not be able to be seen for 3 weeks due to scheduling limitations, so extra time was spent on providing exercises for the shoulder girdle to help with his progression. Unfortunately, pt expressed some lack of confidence in his ability to complete HEP due to work and fatigue, despite best efforts to make it accessible and minimal needed for improvement. Patient would benefit from continued management of limiting condition by skilled physical therapist to address remaining impairments and functional limitations to work towards stated goals and return to PLOF or maximal functional independence.    From initial PT evaluation from 07/23/24: Patient is a 69 y.o. male referred to outpatient physical therapy with a medical diagnosis of neck pain, chronic left shoulder pain  who presents with signs and symptoms consistent with chronic left neck pain that affects function of left shoulder. He is showing signs of responding to repeated retraction with improved cervical spine L rotation active range motion with less pain following retraction exercises. He also seemed to have activation pain of the left cervical spine muscles without strong evidence of  neuro symptoms below the shoulder, but will be more thoroughly tested in the future. His concordant symptoms come on closer to end range L rotation, suggesting lower cervical or upper thoracic spine as region of pain, as does decreased sensation at L 5th finger. He also gets concordant pain at CT junction region with cervical spine extension. Difficulty using his left UE seems to be more specific to loading the upper traps or shoulder girdle muscles that also attach to the spine and head such as the SCM or UT. Plan to further investigate this if repeated motions does not clear symptoms. His pattern of symptoms including (worst pain with movement and with activation of neck to shoulder girdle muscles) paired with the traumatic onset falling onto his left neck and shoulder, describe as overstretching into R cervical sidebending while shoulder was depressed, suggest possible soft tissue trauma that never healed right or that has developed a tendinopathy. Plan to investigate and address further (and refer back for further medical assessment if appropriate) if initial repeated motions exam and approach does not fully resolve symptoms. Patient expressed concern that this is the case, but 2 years is longer than normal healing time for this type of injury, so it should be stable for conservative care at this point and could easily have multi-factorial contributing causes (such as a derangement syndrome) that could be address in PT. He could also have local tissue source of pain from the shoulder, but it seems best to address the neck first and move focus to the shoulder if it is not resolved by attention at the neck, since neck pain generators can masquerade as shoulder pain and cause false positives for shoulder tests. His primary complaint does not mimic usual presentation of shoulder pain. 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 Patient does  have some scheduling challenges. He is only available on Thursdays and Fridays and therapist is not available on Fridays. The next two weeks the clinic is closed for Christmas and New Years Day on Thursday, and the following week is already full, so it will likely be 3-4 weeks before he is seen again. Ideally, he would be seen in 2 days time. However in January, appointments will be available for him weekly. But his scheduling difficulties will slow his progress, at least at first. Patient presents with significant pain, ROM, joint stiffness,  muscle tension, muscle performance (strength/power/endurance), knowledge, and activity tolerance impairments that are limiting ability to complete usual activities such as turning head (L > R), sleeping, using L UE (pushing, pulling, stabilizing, reaching, lifting), working (holding dog on leash), yardwork, raking, carrying a 5 gal bucket of sand (80#) and covering truffles at work (had to go to 2.5 gal bucket), etc without difficulty. Patient will benefit from skilled physical therapy intervention to address current body structure impairments and activity limitations to improve function and work towards goals set in current POC in order to return to prior level of function or maximal functional improvement.   Mechanical sensitivities: cervical L rotation, extension (single bouts)  MDT Assessment Provisional Classification: Derangement vs other Directional Preference: extension Other: Structurally Compromised (history of trauma at start with long history of symptoms since, symptoms match UT or possibly SCM trauma, pain with load to the L arm, but not radicular)  OBJECTIVE IMPAIRMENTS: decreased activity tolerance, decreased endurance, decreased knowledge of condition, decreased ROM, decreased strength, hypomobility, increased muscle spasms, impaired flexibility, impaired sensation, impaired UE functional use, postural dysfunction, and pain.   ACTIVITY LIMITATIONS:  carrying, lifting, sleeping, bed mobility, and reach over head  PARTICIPATION LIMITATIONS: cleaning, laundry, driving, shopping, community activity, occupation, and yard work  PERSONAL FACTORS: Age, Past/current experiences, Time since onset of injury/illness/exacerbation, and 3+ comorbidities:  Impaired fasting blood sugar; Overweight; Scleroderma (HCC); Stable mild fibrotic interstitial lung disease;Other fatigue; Essential hypertension; Hyperlipidemia; Allergic rhinitis due to pollen; Low back pain; Allergic urticaria; Sciatica; Thrombocytopenia; Cellulitis of buttock; CKD stage 3a, GFR 45-59 ml/min (HCC); and Cervical spondylosis; BPH (benign prostatic hyperplasia), Hypercholesterolemia, Intervertebral disc disorder, Kidney stones, and Male erectile disorder are also affecting patient's functional outcome.   REHAB POTENTIAL: Good  CLINICAL DECISION MAKING: Evolving/moderate complexity  EVALUATION COMPLEXITY: Moderate   GOALS: Goals reviewed with patient? No  SHORT TERM GOALS: Target date: 08/06/2024  Patient will be independent with initial home exercise program for self-management of symptoms. Baseline: Initial HEP provided at IE (07/23/2024); Goal status: In progress  LONG TERM GOALS: Target date: 10/15/2024  Patient will be independent with a long-term home exercise program for self-management of symptoms.  Baseline: Initial HEP provided at IE (07/23/2024); Goal status: In progress   2.  Patient will demonstrate improved Upper Extremity Functional Scale (UEFS) to equal or greater than 93% to demonstrate improvement in overall condition and self-reported functional ability.  Baseline: 82% (07/23/2024); Goal status: In progress  3.  Patient will demonstrate L UE MMT/Myotomes of equal or greater than 4/5 without increased symptoms to improve his ability to complete work and household responsibilities with less pain  Baseline: pain with use of L UE including ER limited by concordant  neck pain (07/23/2024); Goal status: In progress  4.  Patient will demonstrate full expected cervical spine AROM wtihout increased symptoms beyond intermittent end range discomfort to improve his ability to view surroundings and sleep.  Baseline: limited and painful - see objective (07/23/2024); Goal status: In progress  5.  Patient will demonstrate improvement in Patient Specific Functional Scale (PSFS) of equal or greater than 8/10 points to reflect clinically significant improvement in patient's most valued functional activities. Baseline: to be measured at visit 2 as appropriate (07/23/2024); Goal status: In progress note  6.  Patient will report NPRS equal or less than 1/10 during functional activities during the last 2 weeks to improve their abilitly to complete community, work and/or recreational activities with less limitation. Baseline: 6/10 (07/23/2024); Goal status: In progress  PLAN:  PT FREQUENCY: 2x/week  PT DURATION: 12 weeks  PLANNED INTERVENTIONS: 97164- PT Re-evaluation, 97750- Physical Performance Testing, 97110-Therapeutic exercises, 97530- Therapeutic activity, [id number]- Neuromuscular re-education, 97535- Self Care, [id number]- Manual therapy, [id number]- Electrical stimulation (unattended), 20560 (1-2 muscles), 20561 (3+ muscles)- Dry Needling, Patient/Family education, Cryotherapy, and Moist heat  PLAN FOR NEXT SESSION: Vitals, continue with MDT progression and build on shoulder girdle strengthening and motor control exercises as appropriate. Update HEP as appropriate.  continue with repeated motions to confirm or exclude MDT classification. Consider further exam of soft tissue, symptom regionalization, cervical traction alleviation testing and progress to matched interventions.   MDT Plan PRINCIPLES OF MANAGEMENT Education: seated posture, POC,  Exercise Type: repeated cervical retraction with self OP to extension with rotation Frequency: every 2-4 hours while awake Other  Exercises / Interventions: sitting with lumbar towel roll, shoulder girdle exercises Management Goals: maintenance of reduction, improve motor control of shoulder girdle and postural muscles, and start strengthening shoulders   Camie R. Juli, PT, DPT, Cert. MDT, PRA-C 08/26/24, 7:15 PM  [facility] [address] [place], [place] [address] P: [phone] I F: [phone]     "

## 2024-09-03 ENCOUNTER — Ambulatory Visit: Admitting: Physical Therapy

## 2024-09-03 ENCOUNTER — Encounter: Payer: Self-pay | Admitting: Physical Therapy

## 2024-09-03 DIAGNOSIS — M546 Pain in thoracic spine: Secondary | ICD-10-CM

## 2024-09-03 DIAGNOSIS — G8929 Other chronic pain: Secondary | ICD-10-CM

## 2024-09-03 DIAGNOSIS — M542 Cervicalgia: Secondary | ICD-10-CM | POA: Diagnosis not present

## 2024-09-03 NOTE — Therapy (Signed)
 " OUTPATIENT PHYSICAL THERAPY TREATMENT   Patient Name: James Hinton MRN: [id number] DOB:1956-03-25, 69 y.o., male Today's Date: 09/03/2024  END OF SESSION:  PT End of Session - 09/03/24 0813     Visit Number 4    Number of Visits 17    Date for Recertification  10/15/24    Authorization Type MEDICARE PART B reporting period from 07/23/2024    Progress Note Due on Visit 10    PT Start Time 0815    PT Stop Time 0855    PT Time Calculation (min) 40 min    Activity Tolerance Patient tolerated treatment well    Behavior During Therapy [facility] for tasks assessed/performed             Past Medical History:  Diagnosis Date   BPH (benign prostatic hyperplasia)    Hypercholesterolemia    Hypertension    Intervertebral disc disorder    lumbar region   Kidney stones    Male erectile disorder    No past surgical history on file. Patient Active Problem List   Diagnosis Date Noted   CKD stage 3a, GFR 45-59 ml/min (HCC) 05/08/2024   Cervical spondylosis 05/08/2024   Yellow jacket sting 02/06/2024   Cellulitis of buttock 01/31/2024   Thrombocytopenia 11/23/2022   Impaired fasting blood sugar 11/16/2022   Overweight 11/16/2022   Scleroderma (HCC) 11/16/2022   Other fatigue 11/16/2022   Allergic rhinitis due to pollen 09/12/2018   Low back pain 02/11/2015   Allergic urticaria 02/11/2015   Sciatica 02/11/2015   Hyperlipidemia 05/15/2011   Essential hypertension 10/01/2006    PCP: Albina GORMAN Dine, MD   REFERRING PROVIDER: Franky Ambrosia, DO [facility])  REFERRING DIAG: neck pain, chronic left shoulder pain   THERAPY DIAG:  Cervicalgia  Chronic left shoulder pain  Pain in thoracic spine  Rationale for Evaluation and Treatment: Rehabilitation  ONSET DATE: 2 years prior to initial PT evaluation  SUBJECTIVE:                                                                                                                                                                                                          PERTINENT HISTORY:  Patient is a 69 y.o. male who presents to outpatient physical therapy with a referral for medical diagnosis neck pain, chronic left shoulder pain. This patient's chief complaints consist of left sided neck pain and left shoulder pain after falling on this part of his body off of a piece of equipment 2 years ago, leading to the following functional deficits:  difficulty turning head (L > R), sleeping, using L UE (pushing, pulling, stabilizing, reaching, lifting), working (holding dog on leash), yardwork, raking, carrying a 5 gal bucket of sand (80#) and covering truffles at work (had to go to 2.5 gal bucket), etc.  . Relevant past medical history and comorbidities include the following: he has Impaired fasting blood sugar; Overweight; Scleroderma (HCC); Stable mild fibrotic interstitial lung disease;Other fatigue; Essential hypertension; Hyperlipidemia; Allergic rhinitis due to pollen; Low back pain; Allergic urticaria; Sciatica; Thrombocytopenia; Cellulitis of buttock; CKD stage 3a, GFR 45-59 ml/min (HCC); and Cervical spondylosis; BPH (benign prostatic hyperplasia), Hypercholesterolemia, Intervertebral disc disorder, Kidney stones, and Male erectile disorder. he  has no past surgical history on file. Patient denies hx of cancer, stroke, seizures, heart problems, diabetes, unexplained weight loss, unexplained changes in bowel or bladder problems, unexplained stumbling or dropping things, osteoporosis, and spinal surgery, or shoulder surgery.   Exercise history: He used to lift weights and his favorite was doing flies. It has been 5-6 years since he has done that.   SUBJECTIVE STATEMENT Patient state his neck has been feeling much better. The right side is not bothering him and his neck feeling is a lot better. He has been doing the exercises like he was supposed to for the most part. The cervical retraction to extension exercise and shoulder  ER exercise seems to be the most helpful. His low back has been bothering him more and he thinks he might be more aware of it since his neck has been feeling better. He also has had more stress on his back with the snow and ice on the ground and shoveling snow.   Hardest motion for L shoulder is lifting weight through flexion.    He used to lift weights and his favorite was doing flies. It has been 5-6 years since he has done that.   PAIN: NPRS: 1-2/10 base of his neck in the middle  From initial PT evaluation on 07/23/24: NPRS: Current: 2-3/10,  Best: 0/10, Worst: 5-6/10. Pain location: pain neck from mastoid process to top of shoulder girdle and a quartar sized distinct spot at the front of his L shoulder.  Pain description: pulling, undescribed, denies paresthesias Aggravating factors: see below Relieving factors: see below   PRECAUTIONS: None  WEIGHT BEARING RESTRICTIONS: No  FALLS:  Has patient fallen in last 6 months? No  PATIENT GOALS: less pain  OBJECTIVE:   Neurological Testing (with MMT) Myotome/MMT Testing Shoulder ER (C5) R: 5/5 L: 4+5/5 by third rep  Movement Loss Movement Loss Symptoms  Flexion min   Retraction nil   Extension min Pain at L CT junction and slightly to left  Lateral flexion R nil   Lateral flexion L min   Rotation R nil   Rotation L  min     Repeated Movement Pre-Test Symptoms Test Movement Symptom During Symptom After Mechanical Response Key Functional Test  Pain only with end range extensoin Rep Retraction to extension with rotation  in sitting 1x10  increases base of neck and slightly to left  No effect No change     Rep Retraction to extension with self OP and rotation  in sitting 2x10  Increases base of neck and slightly to left No effect No change    Repeated retraction with clinician OP in seated, 4x6 Decreases pain Pain abolished No pain with any AROM of cervical spine   No pain Rep Retraction to extension with self OP and  rotation  in sitting 1x10  No effect No effect Still no pain       TREATMENT                                                                                                                          Therapeutic exercise: therapeutic exercises that incorporate ONE parameter at one or more areas of the body to centralize symptoms, develop strength and endurance, range of motion, and flexibility required for successful completion of functional activities.  Cervical spine AROM and shoulder testing to assess progress and response (see above)  Repeated motions (see above).   Education on HEP including handout    Manual therapy: to reduce pain and tissue tension, improve range of motion, neuromodulation, in order to promote improved ability to complete functional activities.  Seated cervical spine retraction with clinician OP (see repeated motions chart under objective)  Neuromuscular Re-education: a technique or exercise performed with the goal of improving the level of communication between the body and the brain, such as for balance, motor control, muscle activation patterns, coordination, desensitization, quality of muscle contraction, proprioception, and/or kinesthetic sense needed for successful and safe completion of functional activities.   Standing banded B scapular retraction with hands holding band/loop at shoulder level, keeping elbows by sides to improve intrascapular recruitment, strength, and endurance while down-training upper trap recruitment. 1x20 with RTB loop Still over activating UT some (L >R) and also performing cervical retraction and thoracic extension to attempt to perform the movement despite cuing.   Standing B shoulder flexion with isometric shoulder ER against RTB loop around back of hands, with elbows at 90 degrees flexion, working on keeping elbows narrower than hands. To improve shoulder girdle and postural shoulder.  2x15-20 with RTB loop  Difficulty with not  overacting UT, and global increased  Added to HEP  Standing rows at [facility] machine with forearms supinated, holding bar at elbow height to improve intrascapular recruitment, posture, scapular position without increased UT activation.  2-3x20 with 35# cable Tactile cuing at B UT to decrease tension, stopping elbow at side of body Added to HEP    Pt required multimodal cuing for proper technique and to facilitate improved neuromuscular control, strength, range of motion, and functional ability resulting in improved performance and form.   PATIENT EDUCATION:  Education details: Exercise purpose/form. Self management techniques. Education on diagnosis, prognosis, POC, anatomy and physiology of current condition. Education on HEP including handout. Person educated: Patient Education method: Explanation, Demonstration, and Handouts Education comprehension: verbalized understanding, returned demonstration, and needs further education  HOME EXERCISE PROGRAM: Access Code: [email or web address] URL: [email or web address] Date: 09/03/2024 Prepared by: Camie Cleverly  Exercises - Cervical Retraction with Overpressure  - 1 sets - 15 reps - 1 second hold - every 2 hours  frequency - Seated Posture with Lumbar Roll  - Shoulder Flexion Wall Slide with Resistance Band  - 1 x weekly - 2-3 sets - 15 reps - Standing Bilateral Low Shoulder Row with Anchored Resistance  - 1  x daily - 2-3 sets - 20 reps - 2 seconds hold  HOME EXERCISE PROGRAM [[email or web address]]  Seated Cervical Retraction-extension-rotation -  Repeat 10 Repetitions, Complete 1 Set, Perform 6 Times a Day  Static retraction in prone - Dustin -  Repeat 10 Repetitions, Complete 1 Set, Perform 6 Times a Day  ASSESSMENT:  CLINICAL IMPRESSION: Patient returns with significant improvement in neck symptoms since last PT session and good participation and tolerance to HEP. He only had neck pain with end range extension at this point and this was again  abolished by cervical retraction with clinician overpressure. Repeated motions HEP progressed to slightly more force to try to fully resolve neck pain. Also progressed exercises for improved shoulder girdle motor control, endurance, and strength. Patient still having difficulty with over-recruitment of neck muscles and poor scapular position with shoulder girdle exercises but did not have worsening of neck pain following. Patient would benefit from continued management of limiting condition by skilled physical therapist to address remaining impairments and functional limitations to work towards stated goals and return to PLOF or maximal functional independence.   From initial PT evaluation from 07/23/24: Patient is a 69 y.o. male referred to outpatient physical therapy with a medical diagnosis of neck pain, chronic left shoulder pain  who presents with signs and symptoms consistent with chronic left neck pain that affects function of left shoulder. He is showing signs of responding to repeated retraction with improved cervical spine L rotation active range motion with less pain following retraction exercises. He also seemed to have activation pain of the left cervical spine muscles without strong evidence of neuro symptoms below the shoulder, but will be more thoroughly tested in the future. His concordant symptoms come on closer to end range L rotation, suggesting lower cervical or upper thoracic spine as region of pain, as does decreased sensation at L 5th finger. He also gets concordant pain at CT junction region with cervical spine extension. Difficulty using his left UE seems to be more specific to loading the upper traps or shoulder girdle muscles that also attach to the spine and head such as the SCM or UT. Plan to further investigate this if repeated motions does not clear symptoms. His pattern of symptoms including (worst pain with movement and with activation of neck to shoulder girdle muscles) paired  with the traumatic onset falling onto his left neck and shoulder, describe as overstretching into R cervical sidebending while shoulder was depressed, suggest possible soft tissue trauma that never healed right or that has developed a tendinopathy. Plan to investigate and address further (and refer back for further medical assessment if appropriate) if initial repeated motions exam and approach does not fully resolve symptoms. Patient expressed concern that this is the case, but 2 years is longer than normal healing time for this type of injury, so it should be stable for conservative care at this point and could easily have multi-factorial contributing causes (such as a derangement syndrome) that could be address in PT. He could also have local tissue source of pain from the shoulder, but it seems best to address the neck first and move focus to the shoulder if it is not resolved by attention at the neck, since neck pain generators can masquerade as shoulder pain and cause false positives for shoulder tests. His primary complaint does not mimic usual presentation of shoulder pain. 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 Patient does have some  scheduling challenges. He is only available on Thursdays and Fridays and therapist is not available on Fridays. The next two weeks the clinic is closed for Christmas and New Years Day on Thursday, and the following week is already full, so it will likely be 3-4 weeks before he is seen again. Ideally, he would be seen in 2 days time. However in January, appointments will be available for him weekly. But his scheduling difficulties will slow his progress, at least at first. Patient presents with significant pain, ROM, joint stiffness, muscle tension, muscle performance (strength/power/endurance), knowledge, and activity tolerance impairments that are limiting ability to complete usual activities such  as turning head (L > R), sleeping, using L UE (pushing, pulling, stabilizing, reaching, lifting), working (holding dog on leash), yardwork, raking, carrying a 5 gal bucket of sand (80#) and covering truffles at work (had to go to 2.5 gal bucket), etc without difficulty. Patient will benefit from skilled physical therapy intervention to address current body structure impairments and activity limitations to improve function and work towards goals set in current POC in order to return to prior level of function or maximal functional improvement.   Mechanical sensitivities: cervical L rotation, extension (single bouts)  MDT Assessment Classification: Cervical Derangement Syndrome Directional Preference: extension  OBJECTIVE IMPAIRMENTS: decreased activity tolerance, decreased endurance, decreased knowledge of condition, decreased ROM, decreased strength, hypomobility, increased muscle spasms, impaired flexibility, impaired sensation, impaired UE functional use, postural dysfunction, and pain.   ACTIVITY LIMITATIONS: carrying, lifting, sleeping, bed mobility, and reach over head  PARTICIPATION LIMITATIONS: cleaning, laundry, driving, shopping, community activity, occupation, and yard work  PERSONAL FACTORS: Age, Past/current experiences, Time since onset of injury/illness/exacerbation, and 3+ comorbidities:  Impaired fasting blood sugar; Overweight; Scleroderma (HCC); Stable mild fibrotic interstitial lung disease;Other fatigue; Essential hypertension; Hyperlipidemia; Allergic rhinitis due to pollen; Low back pain; Allergic urticaria; Sciatica; Thrombocytopenia; Cellulitis of buttock; CKD stage 3a, GFR 45-59 ml/min (HCC); and Cervical spondylosis; BPH (benign prostatic hyperplasia), Hypercholesterolemia, Intervertebral disc disorder, Kidney stones, and Male erectile disorder are also affecting patient's functional outcome.   REHAB POTENTIAL: Good  CLINICAL DECISION MAKING: Evolving/moderate  complexity  EVALUATION COMPLEXITY: Moderate   GOALS: Goals reviewed with patient? No  SHORT TERM GOALS: Target date: 08/06/2024  Patient will be independent with initial home exercise program for self-management of symptoms. Baseline: Initial HEP provided at IE (07/23/2024); Goal status: In progress  LONG TERM GOALS: Target date: 10/15/2024  Patient will be independent with a long-term home exercise program for self-management of symptoms.  Baseline: Initial HEP provided at IE (07/23/2024); Goal status: In progress   2.  Patient will demonstrate improved Upper Extremity Functional Scale (UEFS) to equal or greater than 93% to demonstrate improvement in overall condition and self-reported functional ability.  Baseline: 82% (07/23/2024); Goal status: In progress  3.  Patient will demonstrate L UE MMT/Myotomes of equal or greater than 4/5 without increased symptoms to improve his ability to complete work and household responsibilities with less pain  Baseline: pain with use of L UE including ER limited by concordant neck pain (07/23/2024); Goal status: In progress  4.  Patient will demonstrate full expected cervical spine AROM wtihout increased symptoms beyond intermittent end range discomfort to improve his ability to view surroundings and sleep.  Baseline: limited and painful - see objective (07/23/2024); Goal status: In progress  5.  Patient will demonstrate improvement in Patient Specific Functional Scale (PSFS) of equal or greater than 8/10 points to reflect clinically significant improvement in patient's most valued  functional activities. Baseline: to be measured at visit 2 as appropriate (07/23/2024); Goal status: In progress note  6.  Patient will report NPRS equal or less than 1/10 during functional activities during the last 2 weeks to improve their abilitly to complete community, work and/or recreational activities with less limitation. Baseline: 6/10 (07/23/2024); Goal  status: In progress   PLAN:  PT FREQUENCY: 2x/week  PT DURATION: 12 weeks  PLANNED INTERVENTIONS: 97164- PT Re-evaluation, 97750- Physical Performance Testing, 97110-Therapeutic exercises, 97530- Therapeutic activity, [id number]- Neuromuscular re-education, 97535- Self Care, [id number]- Manual therapy, [id number]- Electrical stimulation (unattended), 20560 (1-2 muscles), 20561 (3+ muscles)- Dry Needling, Patient/Family education, Cryotherapy, and Moist heat  PLAN FOR NEXT SESSION: Vitals, continue with MDT progression and build on shoulder girdle strengthening and motor control exercises as appropriate. Update HEP as appropriate.  continue with repeated motions.. Consider further exam of soft tissue, symptom regionalization, cervical traction alleviation testing and progress to matched interventions.   MDT Plan PRINCIPLES OF MANAGEMENT Education: seated posture, POC,  Exercise Type: repeated cervical retraction with self OP to extension with rotation Frequency: every 2-4 hours while awake Other Exercises / Interventions: sitting with lumbar towel roll, shoulder girdle exercises Management Goals: maintenance of reduction, improve motor control of shoulder girdle and postural muscles, and start strengthening shoulders   Camie R. Juli, PT, DPT, Cert. MDT, PRA-C 09/03/24, 8:13 AM  [facility] [address] [place], [place] [address] P: [phone] I F: [phone]     "

## 2024-09-17 ENCOUNTER — Ambulatory Visit: Admitting: Physical Therapy

## 2024-09-24 ENCOUNTER — Ambulatory Visit: Admitting: Physical Therapy

## 2024-11-27 ENCOUNTER — Ambulatory Visit: Admitting: Internal Medicine
